# Patient Record
Sex: Male | Born: 1960 | Race: White | Hispanic: No | Marital: Single | Smoking: Former smoker
Health system: Southern US, Community
[De-identification: ages and names within clinical notes are randomized; demographics above are authoritative.]

## PROBLEM LIST (undated history)

## (undated) DIAGNOSIS — I2510 Atherosclerotic heart disease of native coronary artery without angina pectoris: Secondary | ICD-10-CM

## (undated) DIAGNOSIS — E785 Hyperlipidemia, unspecified: Secondary | ICD-10-CM

## (undated) DIAGNOSIS — F419 Anxiety disorder, unspecified: Secondary | ICD-10-CM

## (undated) DIAGNOSIS — I219 Acute myocardial infarction, unspecified: Secondary | ICD-10-CM

## (undated) DIAGNOSIS — I10 Essential (primary) hypertension: Secondary | ICD-10-CM

## (undated) DIAGNOSIS — K219 Gastro-esophageal reflux disease without esophagitis: Secondary | ICD-10-CM

## (undated) HISTORY — DX: Essential (primary) hypertension: I10

## (undated) HISTORY — DX: Gastro-esophageal reflux disease without esophagitis: K21.9

## (undated) HISTORY — PX: CORONARY ANGIOPLASTY WITH STENT PLACEMENT: SHX49

## (undated) HISTORY — DX: Hyperlipidemia, unspecified: E78.5

---
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
## 1993-04-29 HISTORY — PX: VASECTOMY: SHX75

## 2004-04-16 ENCOUNTER — Emergency Department (HOSPITAL_COMMUNITY): Admission: EM | Admit: 2004-04-16 | Discharge: 2004-04-17 | Payer: Self-pay | Admitting: Emergency Medicine

## 2004-05-24 ENCOUNTER — Encounter: Admission: RE | Admit: 2004-05-24 | Discharge: 2004-06-01 | Payer: Self-pay | Admitting: Occupational Medicine

## 2006-08-18 ENCOUNTER — Ambulatory Visit (HOSPITAL_BASED_OUTPATIENT_CLINIC_OR_DEPARTMENT_OTHER): Admission: RE | Admit: 2006-08-18 | Discharge: 2006-08-18 | Payer: Self-pay | Admitting: General Surgery

## 2006-08-18 HISTORY — PX: HERNIA REPAIR: SHX51

## 2010-03-15 ENCOUNTER — Encounter (HOSPITAL_COMMUNITY)
Admission: RE | Admit: 2010-03-15 | Discharge: 2010-05-29 | Payer: Self-pay | Source: Home / Self Care | Attending: Cardiology | Admitting: Cardiology

## 2010-05-30 ENCOUNTER — Encounter (HOSPITAL_COMMUNITY): Payer: BC Managed Care – PPO | Attending: Cardiology

## 2010-05-30 DIAGNOSIS — I10 Essential (primary) hypertension: Secondary | ICD-10-CM | POA: Insufficient documentation

## 2010-05-30 DIAGNOSIS — Z8249 Family history of ischemic heart disease and other diseases of the circulatory system: Secondary | ICD-10-CM | POA: Insufficient documentation

## 2010-05-30 DIAGNOSIS — Z87891 Personal history of nicotine dependence: Secondary | ICD-10-CM | POA: Insufficient documentation

## 2010-05-30 DIAGNOSIS — E663 Overweight: Secondary | ICD-10-CM | POA: Insufficient documentation

## 2010-05-30 DIAGNOSIS — Z5189 Encounter for other specified aftercare: Secondary | ICD-10-CM | POA: Insufficient documentation

## 2010-06-01 ENCOUNTER — Encounter (HOSPITAL_COMMUNITY): Payer: BC Managed Care – PPO

## 2010-06-04 ENCOUNTER — Encounter (HOSPITAL_COMMUNITY): Payer: BC Managed Care – PPO

## 2010-06-06 ENCOUNTER — Encounter (HOSPITAL_COMMUNITY): Payer: BC Managed Care – PPO

## 2010-06-08 ENCOUNTER — Encounter (HOSPITAL_COMMUNITY): Payer: BC Managed Care – PPO

## 2010-06-11 ENCOUNTER — Encounter (HOSPITAL_COMMUNITY): Payer: BC Managed Care – PPO

## 2010-06-13 ENCOUNTER — Encounter (HOSPITAL_COMMUNITY): Payer: BC Managed Care – PPO

## 2010-06-15 ENCOUNTER — Encounter (HOSPITAL_COMMUNITY): Payer: BC Managed Care – PPO

## 2010-06-18 ENCOUNTER — Encounter (HOSPITAL_COMMUNITY): Payer: BC Managed Care – PPO

## 2010-06-20 ENCOUNTER — Encounter (HOSPITAL_COMMUNITY): Payer: BC Managed Care – PPO

## 2010-06-22 ENCOUNTER — Encounter (HOSPITAL_COMMUNITY): Payer: BC Managed Care – PPO

## 2010-06-25 ENCOUNTER — Encounter (HOSPITAL_COMMUNITY): Payer: BC Managed Care – PPO

## 2010-06-27 ENCOUNTER — Encounter (HOSPITAL_COMMUNITY): Payer: BC Managed Care – PPO

## 2010-06-29 ENCOUNTER — Encounter (HOSPITAL_COMMUNITY): Payer: BC Managed Care – PPO

## 2010-07-02 ENCOUNTER — Encounter (HOSPITAL_COMMUNITY): Payer: BC Managed Care – PPO

## 2010-07-04 ENCOUNTER — Encounter (HOSPITAL_COMMUNITY): Payer: BC Managed Care – PPO

## 2010-07-06 ENCOUNTER — Encounter (HOSPITAL_COMMUNITY): Payer: BC Managed Care – PPO

## 2010-07-09 ENCOUNTER — Encounter (HOSPITAL_COMMUNITY): Payer: BC Managed Care – PPO

## 2010-07-11 ENCOUNTER — Encounter (HOSPITAL_COMMUNITY): Payer: BC Managed Care – PPO

## 2010-07-13 ENCOUNTER — Encounter (HOSPITAL_COMMUNITY): Payer: BC Managed Care – PPO

## 2010-07-16 ENCOUNTER — Encounter (HOSPITAL_COMMUNITY): Payer: BC Managed Care – PPO

## 2010-07-18 ENCOUNTER — Encounter (HOSPITAL_COMMUNITY): Payer: BC Managed Care – PPO

## 2010-07-20 ENCOUNTER — Encounter (HOSPITAL_COMMUNITY): Payer: BC Managed Care – PPO

## 2010-07-23 ENCOUNTER — Encounter (HOSPITAL_COMMUNITY): Payer: BC Managed Care – PPO

## 2010-07-25 ENCOUNTER — Encounter (HOSPITAL_COMMUNITY): Payer: BC Managed Care – PPO

## 2010-07-27 ENCOUNTER — Encounter (HOSPITAL_COMMUNITY): Payer: BC Managed Care – PPO

## 2010-07-30 ENCOUNTER — Encounter (HOSPITAL_COMMUNITY): Payer: BC Managed Care – PPO

## 2010-08-01 ENCOUNTER — Encounter (HOSPITAL_COMMUNITY): Payer: BC Managed Care – PPO

## 2010-08-03 ENCOUNTER — Encounter (HOSPITAL_COMMUNITY): Payer: BC Managed Care – PPO

## 2010-08-06 ENCOUNTER — Encounter (HOSPITAL_COMMUNITY): Payer: BC Managed Care – PPO

## 2010-08-08 ENCOUNTER — Encounter (HOSPITAL_COMMUNITY): Payer: BC Managed Care – PPO

## 2010-08-10 ENCOUNTER — Encounter (HOSPITAL_COMMUNITY): Payer: BC Managed Care – PPO

## 2010-08-13 ENCOUNTER — Encounter (HOSPITAL_COMMUNITY): Payer: BC Managed Care – PPO

## 2010-08-15 ENCOUNTER — Encounter (HOSPITAL_COMMUNITY): Payer: BC Managed Care – PPO

## 2010-08-17 ENCOUNTER — Encounter (HOSPITAL_COMMUNITY): Payer: BC Managed Care – PPO

## 2010-08-20 ENCOUNTER — Encounter (HOSPITAL_COMMUNITY): Payer: BC Managed Care – PPO

## 2010-09-14 NOTE — Op Note (Signed)
NAME:  Shane Shea, Shane Shea              ACCOUNT NO.:  [id number]   MEDICAL RECORD NO.:  [id number]          PATIENT TYPE:  AMB   LOCATION:  DSC                          FACILITY:  [facility]   PHYSICIAN:  Gabrielle Dare. Janee Morn, M.D.DATE OF BIRTH:  1960/11/07   DATE OF PROCEDURE:  08/18/2006  DATE OF DISCHARGE:                               OPERATIVE REPORT   PREOPERATIVE DIAGNOSIS:  Bilateral inguinal hernias.   POSTOPERATIVE DIAGNOSIS:  Bilateral inguinal hernias.   PROCEDURE:  Repair of bilateral inguinal hernia with mesh.   SURGEON:  Gabrielle Dare. Janee Morn, M.D.   ANESTHESIA:  General.   HISTORY OF PRESENT ILLNESS:  Shane Shea is a 50 year old gentleman who I  evaluated in the office for symptomatic bilateral inguinal hernias.  He  presents today for elective repair.   PROCEDURE IN DETAIL:  Informed consent was obtained.  The patient  received intravenous antibiotics.  He was brought to the operating room.  General anesthesia was administered by the anesthesia staff with  laryngeal mask airway.  The lower abdomen was prepped and draped in a  sterile fashion.  Marcaine 0.25% with epinephrine was injected along the  planned incision in the right groin and out towards the anterior  superior iliac spine for postoperative pain relief.  A right groin  incision was made.  Subcutaneous tissues were dissected down through  Scarpa fascia revealing the external oblique.  The external oblique was  divided sharply laterally and division continued down through the  external ring superiorly where the external oblique was dissected free  off the underlying transversalis.  The inferior leaflet of external  oblique was dissected down, revealing the shelving edge of the inguinal  ligament.  Cord structures were encircled with a Penrose drain.  Evaluation of the floor revealed a moderate sized direct inguinal  hernia.  The cord was then dissected and no indirect inguinal hernia sac  was present.  The direct  hernia was reduced without entering the sac.  A  figure-of-eight 2-0 Vicryl was placed to keep it reduced and  subsequently the repair was completed with a keyhole polypropylene mesh.  This was secured with 0 Prolene to the tissues over the pubic tubercle  and it was secured in a running fashion inferiorly along the shelving  edge of the inguinal ligament.  The superior portion of the mesh was  then tacked in an interrupted fashion with 0 Prolene to the tissues over  her pubic tubercle medially and then extending out laterally with  interrupted 0 Prolene tacking the mesh down to the transversalis  musculature.  The two ends of the mesh were rejoined behind the cord and  tacked together and down through the underlying musculature with  interrupted 0 Prolene sutures.  The aperture in the mesh admitted the  tip of a fifth digit.  Meticulous hemostasis was insured.  The area was  irrigated.  External oblique was closed with running 2-0 Vicryl suture.  Subcutaneous tissues were approximated with interrupted 3-0 Vicryl  suture.  The area was again irrigated and the skin was closed with a  running 4-0 Monocryl subcuticular stitch.  Sponge, needle, and  instrument counts were correct.  Benzoin and Steri-Strips were applied.  Attention was then directed to the left side.  Again, 0.25% Marcaine  with epinephrine was injected along the planned line of inguinal  incision and out towards the anterior superior iliac spine.  The left  groin incision was made.  Subcutaneous tissues were dissected down  through Scarpa fascia revealing the external oblique.  Hemostasis was  obtained with Bovie cautery.  External oblique was divided out laterally  and the division was continued down through the external ring.  The  superior leaflet of the external oblique was dissected free off the  transversalis.  The inferior leaflet was dissected down, revealing the  shelving edge of the inguinal ligament.  Cord  structures were encircled  with a Penrose drain.  Inspection of the floor revealed a large direct  hernia sac.  This was dissected free off the cord structures.  It did  reduce easily.  The sac was not entered.  The cord was generous size.  The cord was dissected.  No indirect hernia sac was noted, but there was  a large cord lipoma, which was excised.  The direct hernia sac was then  held in reduction and two figure-of-eight 2-0 Vicryls were placed from  the transversalis musculature over to the shelving edge of the inguinal  ligament to keep the hernia reduced during the completion of the repair.  The repair was then done with a keyhole polypropylene mesh, secured with  0 Prolene sutures, just as on the other side.  Meticulous hemostasis was  insured.  The aperture in the mesh was adjusted to just admit the tip of  the fifth digit next to the cord structures.  The cord structures  remained nonedematous and viable.  The area was irrigated.  External  oblique fascia was closed with a running 2-0 Vicryl suture.  Subcutaneous tissues were approximated with interrupted 3-0 Vicryl  sutures.  Skin was closed with a running 4-0 Monocryl subcuticular  stitch.  Sponge, needle, and instrument counts were correct again.  Benzoin, Steri-Strips, and sterile dressings were applied to both  wounds.  The patient tolerated the procedure well without apparent  complication and was taken to recovery room in stable condition.  Prior  to transport, testicles were relocated in anatomic position in the  scrotum.      Gabrielle Dare Janee Morn, M.D.  Electronically Signed     BET/MEDQ  D:  08/18/2006  T:  08/18/2006  Job:  [id number]   cc:   Vikki Ports, M.D.

## 2011-02-19 ENCOUNTER — Emergency Department (HOSPITAL_COMMUNITY): Payer: BC Managed Care – PPO

## 2011-02-19 ENCOUNTER — Emergency Department (HOSPITAL_COMMUNITY)
Admission: EM | Admit: 2011-02-19 | Discharge: 2011-02-19 | Disposition: A | Discharge status: Home / Self Care | Payer: BC Managed Care – PPO | Attending: Emergency Medicine | Admitting: Emergency Medicine

## 2011-02-19 DIAGNOSIS — R079 Chest pain, unspecified: Secondary | ICD-10-CM | POA: Insufficient documentation

## 2011-02-19 DIAGNOSIS — I252 Old myocardial infarction: Secondary | ICD-10-CM | POA: Insufficient documentation

## 2011-02-19 DIAGNOSIS — Z79899 Other long term (current) drug therapy: Secondary | ICD-10-CM | POA: Insufficient documentation

## 2011-02-19 DIAGNOSIS — I10 Essential (primary) hypertension: Secondary | ICD-10-CM | POA: Insufficient documentation

## 2011-02-19 DIAGNOSIS — R1013 Epigastric pain: Secondary | ICD-10-CM | POA: Insufficient documentation

## 2011-02-19 DIAGNOSIS — E7800 Pure hypercholesterolemia, unspecified: Secondary | ICD-10-CM | POA: Insufficient documentation

## 2011-02-19 LAB — COMPREHENSIVE METABOLIC PANEL
ALT: 51 U/L (ref 0–53)
AST: 26 U/L (ref 0–37)
Albumin: 4.5 g/dL (ref 3.5–5.2)
Alkaline Phosphatase: 111 U/L (ref 39–117)
BUN: 13 mg/dL (ref 6–23)
CO2: 31 mEq/L (ref 19–32)
Calcium: 10.3 mg/dL (ref 8.4–10.5)
Chloride: 100 mEq/L (ref 96–112)
Creatinine, Ser: 0.97 mg/dL (ref 0.50–1.35)
GFR calc non Af Amer: >90 mL/min (ref >90)
Glucose, Bld: 112 mg/dL — ABNORMAL HIGH (ref 70–99)
Potassium: 4.7 mEq/L (ref 3.5–5.1)
Total Bilirubin: 0.6 mg/dL (ref 0.3–1.2)
Total Protein: 8.1 g/dL (ref 6.0–8.3)

## 2011-02-19 LAB — LIPASE, BLOOD: Lipase: 38 U/L (ref 11–59)

## 2011-02-19 LAB — CBC
HCT: 47.3 % (ref 39.0–52.0)
Hemoglobin: 15.7 g/dL (ref 13.0–17.0)
MCH: 29 pg (ref 26.0–34.0)
MCHC: 33.2 g/dL (ref 30.0–36.0)
MCV: 87.3 fL (ref 78.0–100.0)
Platelets: 181 10*3/uL (ref 150–400)
RBC: 5.42 MIL/uL (ref 4.22–5.81)
RDW: 12.7 % (ref 11.5–15.5)
WBC: 5.5 10*3/uL (ref 4.0–10.5)

## 2011-02-19 LAB — DIFFERENTIAL
Basophils Absolute: 0.1 10*3/uL (ref 0.0–0.1)
Basophils Relative: 1 % (ref 0–1)
Eosinophils Relative: 1 % (ref 0–5)
Lymphocytes Relative: 20 % (ref 12–46)
Lymphs Abs: 1.1 10*3/uL (ref 0.7–4.0)
Monocytes Absolute: 0.4 10*3/uL (ref 0.1–1.0)
Monocytes Relative: 7 % (ref 3–12)
Neutro Abs: 4 10*3/uL (ref 1.7–7.7)
Neutrophils Relative %: 72 % (ref 43–77)

## 2011-02-19 LAB — POCT I-STAT TROPONIN I
Troponin i, poc: 0 ng/mL (ref 0.00–0.08)
Troponin i, poc: 0.01 ng/mL (ref 0.00–0.08)

## 2011-11-20 ENCOUNTER — Encounter (INDEPENDENT_AMBULATORY_CARE_PROVIDER_SITE_OTHER): Payer: Self-pay | Admitting: Surgery

## 2011-11-21 ENCOUNTER — Encounter (INDEPENDENT_AMBULATORY_CARE_PROVIDER_SITE_OTHER): Payer: Self-pay | Admitting: General Surgery

## 2011-11-21 ENCOUNTER — Encounter (INDEPENDENT_AMBULATORY_CARE_PROVIDER_SITE_OTHER): Payer: Self-pay | Admitting: Surgery

## 2011-11-21 ENCOUNTER — Ambulatory Visit (INDEPENDENT_AMBULATORY_CARE_PROVIDER_SITE_OTHER): Payer: BC Managed Care – PPO | Admitting: Surgery

## 2011-11-21 VITALS — BP 154/90 | HR 53 | Temp 97.4°F | Ht 70.0 in | Wt 178.6 lb

## 2011-11-21 DIAGNOSIS — K4091 Unilateral inguinal hernia, without obstruction or gangrene, recurrent: Secondary | ICD-10-CM

## 2011-11-21 NOTE — Progress Notes (Signed)
Patient ID: Shane Shea, male   DOB: 07/29/1960, 51 y.o.   MRN: [id number]  Chief Complaint  Patient presents with  . Pre-op Exam    eval LIH    HPI Shane Shea is a 51 y.o. male.  Self-referred for hernia HPI This is a 51-year-old male who is status post open bilateral inguinal hernia repairs with mesh for bilateral direct hernias by Dr. Burke Thompson in 2008. Over the last couple of years the patient has developed a recurrent mass in his left groin. This has become larger and more uncomfortable. Unfortunately he has also had a myocardial infarction during the last few years. He had stents placed in [place] and is followed by Dr. Tyson a cardiologist in [place]. He is on aspirin as well as Effient. He denies any obstructive symptoms. He presents now for surgical evaluation. Past Medical History  Diagnosis Date  . GERD (gastroesophageal reflux disease)   . Hyperlipidemia   . Hypertension   . Heart attack     Past Surgical History  Procedure Date  . Hernia repair 08/18/2006    Bilateral Inguinal Hernias  . External ear surgery 1968  . Vasectomy 1995    History reviewed. No pertinent family history.  Social History History  Substance Use Topics  . Smoking status: Former Smoker  . Smokeless tobacco: Former User    Quit date: 11/21/1987  . Alcohol Use: No    No Known Allergies  Current Outpatient Prescriptions  Medication Sig Dispense Refill  . aspirin 81 MG tablet Take 81 mg by mouth daily.      . atorvastatin (LIPITOR) 80 MG tablet       . EFFIENT 10 MG TABS       . metoprolol tartrate (LOPRESSOR) 25 MG tablet       . nitroGLYCERIN (NITROSTAT) 0.4 MG SL tablet Place 0.4 mg under the tongue as needed.      . pantoprazole (PROTONIX) 40 MG tablet Take 40 mg by mouth daily.        Review of Systems Review of Systems  Constitutional: Negative for fever, chills and unexpected weight change.  HENT: Negative for hearing loss, congestion, sore throat, trouble  swallowing and voice change.   Eyes: Negative for visual disturbance.  Respiratory: Negative for cough and wheezing.   Cardiovascular: Negative for chest pain, palpitations and leg swelling.  Gastrointestinal: Negative for nausea, vomiting, abdominal pain, diarrhea, constipation, blood in stool, abdominal distention, anal bleeding and rectal pain.  Genitourinary: Positive for scrotal swelling. Negative for hematuria and difficulty urinating.  Musculoskeletal: Negative for arthralgias.  Skin: Negative for rash and wound.  Neurological: Negative for seizures, syncope, weakness and headaches.  Hematological: Negative for adenopathy. Does not bruise/bleed easily.  Psychiatric/Behavioral: Negative for confusion.    Blood pressure 154/90, pulse 53, temperature 97.4 F (36.3 C), temperature source Temporal, height 5' 10" (1.778 m), weight 178 lb 9.6 oz (81.012 kg), SpO2 98.00%.  Physical Exam Physical Exam WDWN in NAD HEENT:  EOMI, sclera anicteric Neck:  No masses, no thyromegaly Lungs:  CTA bilaterally; normal respiratory effort CV:  Regular rate and rhythm; no murmurs Abd:  +bowel sounds, soft, non-tender, no masses GU:  Healed incisions; large palpable left inguinal hernia - partially reducible; tender with palpation Ext:  Well-perfused; no edema Skin:  Warm, dry; no sign of jaundice  Data Reviewed none  Assessment    Recurrent left inguinal hernia. Coronary artery disease    Plan    Left inguinal   hernia repair with mesh.  The surgical procedure has been discussed with the patient.  Potential risks, benefits, alternative treatments, and expected outcomes have been explained.  All of the patient's questions at this time have been answered.  The likelihood of reaching the patient's treatment goal is good.  The patient understand the proposed surgical procedure and wishes to proceed. We will first obtain cardiac clearance from the cardiologist.  He will need to hold aspirin and  Effient prior to surgery.       Amee Boothe K. 11/21/2011, 1:11 PM    

## 2011-12-03 ENCOUNTER — Other Ambulatory Visit (INDEPENDENT_AMBULATORY_CARE_PROVIDER_SITE_OTHER): Payer: Self-pay | Admitting: Surgery

## 2011-12-11 ENCOUNTER — Encounter (HOSPITAL_BASED_OUTPATIENT_CLINIC_OR_DEPARTMENT_OTHER): Payer: Self-pay | Admitting: *Deleted

## 2011-12-11 NOTE — Progress Notes (Signed)
Bring all medications. Coming in for Bmet on Friday. Requested last office notes, EkG from Dr. Chales Abrahams Cardiologist)  in [place].

## 2011-12-13 ENCOUNTER — Encounter (HOSPITAL_BASED_OUTPATIENT_CLINIC_OR_DEPARTMENT_OTHER)
Admission: RE | Admit: 2011-12-13 | Discharge: 2011-12-13 | Disposition: A | Discharge status: Home / Self Care | Payer: BC Managed Care – PPO | Source: Ambulatory Visit | Attending: Surgery | Admitting: Surgery

## 2011-12-13 LAB — BASIC METABOLIC PANEL
BUN: 21 mg/dL (ref 6–23)
Calcium: 9.2 mg/dL (ref 8.4–10.5)
Chloride: 101 mEq/L (ref 96–112)
GFR calc Af Amer: >90 mL/min (ref >90)
GFR calc non Af Amer: >90 mL/min (ref >90)
Glucose, Bld: 94 mg/dL (ref 70–99)
Potassium: 4.6 mEq/L (ref 3.5–5.1)
Sodium: 139 mEq/L (ref 135–145)

## 2011-12-17 ENCOUNTER — Encounter (HOSPITAL_BASED_OUTPATIENT_CLINIC_OR_DEPARTMENT_OTHER)
Admission: RE | Disposition: A | Discharge status: Home / Self Care | Payer: Self-pay | Source: Ambulatory Visit | Attending: Surgery

## 2011-12-17 ENCOUNTER — Encounter (HOSPITAL_BASED_OUTPATIENT_CLINIC_OR_DEPARTMENT_OTHER): Payer: Self-pay | Admitting: Anesthesiology

## 2011-12-17 ENCOUNTER — Encounter (HOSPITAL_BASED_OUTPATIENT_CLINIC_OR_DEPARTMENT_OTHER): Payer: Self-pay | Admitting: Certified Registered"

## 2011-12-17 ENCOUNTER — Encounter (HOSPITAL_BASED_OUTPATIENT_CLINIC_OR_DEPARTMENT_OTHER): Payer: Self-pay | Admitting: *Deleted

## 2011-12-17 ENCOUNTER — Ambulatory Visit (HOSPITAL_BASED_OUTPATIENT_CLINIC_OR_DEPARTMENT_OTHER)
Admission: RE | Admit: 2011-12-17 | Discharge: 2011-12-17 | Disposition: A | Discharge status: Home / Self Care | Payer: BC Managed Care – PPO | Source: Ambulatory Visit | Attending: Surgery | Admitting: Surgery

## 2011-12-17 ENCOUNTER — Ambulatory Visit (HOSPITAL_BASED_OUTPATIENT_CLINIC_OR_DEPARTMENT_OTHER): Payer: BC Managed Care – PPO | Admitting: Anesthesiology

## 2011-12-17 DIAGNOSIS — K4091 Unilateral inguinal hernia, without obstruction or gangrene, recurrent: Secondary | ICD-10-CM | POA: Insufficient documentation

## 2011-12-17 DIAGNOSIS — Z01812 Encounter for preprocedural laboratory examination: Secondary | ICD-10-CM | POA: Insufficient documentation

## 2011-12-17 DIAGNOSIS — E785 Hyperlipidemia, unspecified: Secondary | ICD-10-CM | POA: Insufficient documentation

## 2011-12-17 DIAGNOSIS — K219 Gastro-esophageal reflux disease without esophagitis: Secondary | ICD-10-CM | POA: Insufficient documentation

## 2011-12-17 DIAGNOSIS — I10 Essential (primary) hypertension: Secondary | ICD-10-CM | POA: Insufficient documentation

## 2011-12-17 HISTORY — DX: Anxiety disorder, unspecified: F41.9

## 2011-12-17 HISTORY — PX: INGUINAL HERNIA REPAIR: SHX194

## 2011-12-17 SURGERY — REPAIR, HERNIA, INGUINAL, ADULT
Anesthesia: General | Site: Abdomen | Laterality: Left | Wound class: Clean

## 2011-12-17 MED ORDER — MORPHINE SULFATE 2 MG/ML IJ SOLN: 2.0000 mg | INTRAMUSCULAR | Status: DC | PRN

## 2011-12-17 MED ORDER — SCOPOLAMINE 1 MG/3DAYS TD PT72
1.0000 | MEDICATED_PATCH | Freq: Once | TRANSDERMAL | Status: DC
  Administered 2011-12-17: 1.5 mg via TRANSDERMAL

## 2011-12-17 MED ORDER — ONDANSETRON HCL 4 MG/2ML IJ SOLN
INTRAMUSCULAR | Status: DC | PRN
  Administered 2011-12-17: 4 mg via INTRAVENOUS

## 2011-12-17 MED ORDER — DEXAMETHASONE SODIUM PHOSPHATE 4 MG/ML IJ SOLN
INTRAMUSCULAR | Status: DC | PRN
  Administered 2011-12-17: 10 mg via INTRAVENOUS

## 2011-12-17 MED ORDER — OXYCODONE HCL 5 MG PO TABS: 5.0000 mg | ORAL_TABLET | Freq: Once | ORAL | Status: DC | PRN

## 2011-12-17 MED ORDER — CEFAZOLIN SODIUM-DEXTROSE 2-3 GM-% IV SOLR: 2.0000 g | INTRAVENOUS | Status: DC

## 2011-12-17 MED ORDER — OXYCODONE-ACETAMINOPHEN 5-325 MG PO TABS: 1.0000 | ORAL_TABLET | ORAL | Status: DC | PRN

## 2011-12-17 MED ORDER — PROPOFOL 10 MG/ML IV EMUL: INTRAVENOUS | Status: DC | PRN

## 2011-12-17 MED ORDER — BUPIVACAINE-EPINEPHRINE 0.25% -1:200000 IJ SOLN
INTRAMUSCULAR | Status: DC | PRN
  Administered 2011-12-17: 10 mL

## 2011-12-17 MED ORDER — MIDAZOLAM HCL 2 MG/2ML IJ SOLN
1.0000 mg | INTRAMUSCULAR | Status: DC | PRN
  Administered 2011-12-17: 2 mg via INTRAVENOUS

## 2011-12-17 MED ORDER — LACTATED RINGERS IV SOLN
INTRAVENOUS | Status: DC
  Administered 2011-12-17 (×2): via INTRAVENOUS

## 2011-12-17 MED ORDER — KETOROLAC TROMETHAMINE 30 MG/ML IJ SOLN
INTRAMUSCULAR | Status: DC | PRN
  Administered 2011-12-17: 30 mg via INTRAVENOUS

## 2011-12-17 MED ORDER — HYDROMORPHONE HCL PF 1 MG/ML IJ SOLN: 0.2500 mg | INTRAMUSCULAR | Status: DC | PRN

## 2011-12-17 MED ORDER — PROPOFOL 10 MG/ML IV EMUL
INTRAVENOUS | Status: DC | PRN
  Administered 2011-12-17: 230 mg via INTRAVENOUS
  Administered 2011-12-17: 35 mg via INTRAVENOUS

## 2011-12-17 MED ORDER — OXYCODONE-ACETAMINOPHEN 5-325 MG PO TABS: 1.0000 | ORAL_TABLET | ORAL | Status: AC | PRN

## 2011-12-17 MED ORDER — FENTANYL CITRATE 0.05 MG/ML IJ SOLN
50.0000 ug | INTRAMUSCULAR | Status: DC | PRN
  Administered 2011-12-17: 100 ug via INTRAVENOUS

## 2011-12-17 MED ORDER — GLYCOPYRROLATE 0.2 MG/ML IJ SOLN
INTRAMUSCULAR | Status: DC | PRN
  Administered 2011-12-17 (×2): 0.1 mg via INTRAVENOUS

## 2011-12-17 MED ORDER — ACETAMINOPHEN 10 MG/ML IV SOLN
1000.0000 mg | Freq: Once | INTRAVENOUS | Status: AC
  Administered 2011-12-17: 1000 mg via INTRAVENOUS

## 2011-12-17 MED ORDER — ONDANSETRON HCL 4 MG/2ML IJ SOLN: 4.0000 mg | INTRAMUSCULAR | Status: DC | PRN

## 2011-12-17 MED ORDER — FENTANYL CITRATE 0.05 MG/ML IJ SOLN
INTRAMUSCULAR | Status: DC | PRN
  Administered 2011-12-17: 25 ug via INTRAVENOUS

## 2011-12-17 MED ORDER — MIDAZOLAM HCL 5 MG/5ML IJ SOLN
INTRAMUSCULAR | Status: DC | PRN
  Administered 2011-12-17: 0.5 mg via INTRAVENOUS

## 2011-12-17 MED ORDER — OXYCODONE HCL 5 MG/5ML PO SOLN: 5.0000 mg | Freq: Once | ORAL | Status: DC | PRN

## 2011-12-17 MED ORDER — CHLORHEXIDINE GLUCONATE 4 % EX LIQD: 1.0000 "application " | Freq: Once | CUTANEOUS | Status: DC

## 2011-12-17 MED ORDER — METOCLOPRAMIDE HCL 5 MG/ML IJ SOLN: 10.0000 mg | Freq: Once | INTRAMUSCULAR | Status: DC | PRN

## 2011-12-17 MED ORDER — BUPIVACAINE HCL (PF) 0.5 % IJ SOLN
INTRAMUSCULAR | Status: DC | PRN
  Administered 2011-12-17: 25 mL

## 2011-12-17 SURGICAL SUPPLY — 55 items
APL SKNCLS STERI-STRIP NONHPOA (GAUZE/BANDAGES/DRESSINGS) ×2
BENZOIN TINCTURE PRP APPL 2/3 (GAUZE/BANDAGES/DRESSINGS) ×3 IMPLANT
BLADE HEX COATED 2.75 (ELECTRODE) ×3 IMPLANT
BLADE SURG 15 STRL LF DISP TIS (BLADE) ×2 IMPLANT
BLADE SURG 15 STRL SS (BLADE) ×3
BLADE SURG ROTATE 9660 (MISCELLANEOUS) IMPLANT
CANISTER SUCTION 1200CC (MISCELLANEOUS) IMPLANT
CHLORAPREP W/TINT 26ML (MISCELLANEOUS) ×3 IMPLANT
CLOTH BEACON ORANGE TIMEOUT ST (SAFETY) ×3 IMPLANT
COVER MAYO STAND STRL (DRAPES) ×3 IMPLANT
COVER TABLE BACK 60X90 (DRAPES) ×3 IMPLANT
DECANTER SPIKE VIAL GLASS SM (MISCELLANEOUS) ×3 IMPLANT
DRAIN PENROSE 1/2X12 LTX STRL (WOUND CARE) ×3 IMPLANT
DRAPE LAPAROTOMY TRNSV 102X78 (DRAPE) ×3 IMPLANT
DRAPE UTILITY XL STRL (DRAPES) ×3 IMPLANT
DRSG TEGADERM 4X4.75 (GAUZE/BANDAGES/DRESSINGS) ×3 IMPLANT
ELECT REM PT RETURN 9FT ADLT (ELECTROSURGICAL) ×3
ELECTRODE REM PT RTRN 9FT ADLT (ELECTROSURGICAL) ×2 IMPLANT
GAUZE SPONGE 4X4 12PLY STRL LF (GAUZE/BANDAGES/DRESSINGS) ×3 IMPLANT
GLOVE BIO SURGEON STRL SZ 6.5 (GLOVE) ×2 IMPLANT
GLOVE BIO SURGEON STRL SZ7 (GLOVE) ×3 IMPLANT
GLOVE BIOGEL PI IND STRL 7.5 (GLOVE) ×3 IMPLANT
GLOVE BIOGEL PI INDICATOR 7.5 (GLOVE) ×2
GLOVE SKINSENSE NS SZ7.0 (GLOVE) ×1
GLOVE SKINSENSE STRL SZ7.0 (GLOVE) ×1 IMPLANT
GOWN PREVENTION PLUS XLARGE (GOWN DISPOSABLE) ×8 IMPLANT
NDL HYPO 25X1 1.5 SAFETY (NEEDLE) ×1 IMPLANT
NEEDLE HYPO 25X1 1.5 SAFETY (NEEDLE) ×3 IMPLANT
NS IRRIG 1000ML POUR BTL (IV SOLUTION) ×2 IMPLANT
PACK BASIN DAY SURGERY FS (CUSTOM PROCEDURE TRAY) ×3 IMPLANT
PAIN PUMP ON-Q 100MLX2ML 2.5IN (PAIN MANAGEMENT) IMPLANT
PENCIL BUTTON HOLSTER BLD 10FT (ELECTRODE) ×3 IMPLANT
SLEEVE SCD COMPRESS KNEE MED (MISCELLANEOUS) ×3 IMPLANT
SPONGE INTESTINAL PEANUT (DISPOSABLE) ×3 IMPLANT
SPONGE LAP 18X18 X RAY DECT (DISPOSABLE) IMPLANT
SPONGE LAP 4X18 X RAY DECT (DISPOSABLE) ×3 IMPLANT
STRIP CLOSURE SKIN 1/2X4 (GAUZE/BANDAGES/DRESSINGS) ×3 IMPLANT
SUT MON AB 4-0 PC3 18 (SUTURE) ×3 IMPLANT
SUT PDS AB 0 CT 36 (SUTURE) IMPLANT
SUT PROLENE 2 0 SH DA (SUTURE) ×3 IMPLANT
SUT SILK 2 0 SH (SUTURE) IMPLANT
SUT SILK 3 0 SH 30 (SUTURE) IMPLANT
SUT SILK 3 0 TIES 17X18 (SUTURE)
SUT SILK 3-0 18XBRD TIE BLK (SUTURE) IMPLANT
SUT VIC AB 0 SH 27 (SUTURE) IMPLANT
SUT VIC AB 2-0 SH 27 (SUTURE) ×6
SUT VIC AB 2-0 SH 27XBRD (SUTURE) ×4 IMPLANT
SUT VIC AB 3-0 SH 27 (SUTURE) ×3
SUT VIC AB 3-0 SH 27X BRD (SUTURE) ×2 IMPLANT
SYR CONTROL 10ML LL (SYRINGE) ×3 IMPLANT
TOWEL OR 17X24 6PK STRL BLUE (TOWEL DISPOSABLE) ×3 IMPLANT
TOWEL OR NON WOVEN STRL DISP B (DISPOSABLE) ×3 IMPLANT
TUBE CONNECTING 20X1/4 (TUBING) IMPLANT
WATER STERILE IRR 1000ML POUR (IV SOLUTION) ×1 IMPLANT
YANKAUER SUCT BULB TIP NO VENT (SUCTIONS) IMPLANT

## 2011-12-17 NOTE — Op Note (Signed)
Hernia, Open, Procedure Note  Indications: The patient presented with a history of a left, reducible recurrent inguinal hernia.    Pre-operative Diagnosis: left reducible recurrent inguinal hernia Post-operative Diagnosis: same  Surgeon: Wynona Luna.   Assistants: none  Anesthesia: General LMA anesthesia  ASA Class: 3  Procedure Details  The patient was seen again in the Holding Room. The risks, benefits, complications, treatment options, and expected outcomes were discussed with the patient. The possibilities of reaction to medication, pulmonary aspiration, perforation of viscus, bleeding, recurrent infection, the need for additional procedures, and development of a complication requiring transfusion or further operation were discussed with the patient and/or family. The likelihood of success in repairing the hernia and returning the patient to their previous functional status is good.  There was concurrence with the proposed plan, and informed consent was obtained. The site of surgery was properly noted/marked. The patient was taken to the Operating Room, identified as Shane Shea, and the procedure verified as left inguinal hernia repair. A Time Out was held and the above information confirmed.  The patient was placed in the supine position and underwent induction of anesthesia. The lower abdomen and groin was prepped with Chloraprep and draped in the standard fashion, and 0.5% Marcaine with epinephrine was used to anesthetize the skin over the mid-portion of the inguinal canal. An oblique incision was made. Dissection was carried down through the scar in the subcutaneous tissue with cautery to the external oblique fascia.  We opened the external oblique fascia along the direction of its fibers to the external ring.  The spermatic cord was circumferentially dissected bluntly and retracted with a Penrose drain.  The floor of the inguinal canal was inspected.  The previously placed mesh  covered the floor of the inguinal canal, but was not attached to the pubic tubercle, resulting in a direct defect.  We reduced the large hernia sac through the defect, which measured only about 1 cm across.  We reattached the mesh to the pubic tubercle with 2-0 Prolene. The spermatic cord was examined and showed no sign of indirect hernia.  The external oblique fascia was reapproximated with 2-0 Vicryl.  3-0 Vicryl was used to close the subcutaneous tissues and 4-0 Monocryl was used to close the skin in subcuticular fashion.  Benzoin and steri-strips were used to seal the incision.  A clean dressing was applied.  The patient was then extubated and brought to the recovery room in stable condition.  All sponge, instrument, and needle counts were correct prior to closure and at the conclusion of the case.   Estimated Blood Loss: less than 50 mL                 Complications: None; patient tolerated the procedure well.         Disposition: PACU - hemodynamically stable.         Condition: stable  Wilmon Arms. Corliss Skains, MD, [facility]  12/17/2011 3:43 PM

## 2011-12-17 NOTE — Anesthesia Postprocedure Evaluation (Signed)
Anesthesia Post Note  Patient: Shane Shea  Procedure(s) Performed: Procedure(s) (LRB): HERNIA REPAIR INGUINAL ADULT (Left)  Anesthesia type: General  Patient location: PACU  Post pain: Pain level controlled  Post assessment: Patient's Cardiovascular Status Stable  Last Vitals:  Filed Vitals:   12/17/11 1645  BP: 153/94  Pulse: 58  Temp: 36.3 C  Resp: 16    Post vital signs: Reviewed and stable  Level of consciousness: alert  Complications: No apparent anesthesia complications

## 2011-12-17 NOTE — Progress Notes (Signed)
Assisted Dr. Leiland with left, ultrasound guided, transabdominal plane block. Side rails up, monitors on throughout procedure. See vital signs in flow sheet. Tolerated Procedure well. 

## 2011-12-17 NOTE — Anesthesia Procedure Notes (Signed)
Anesthesia Regional Block:  TAP block  Pre-Anesthetic Checklist: ,, timeout performed, Correct Patient, Correct Site, Correct Laterality, Correct Procedure, Correct Position, site marked, Risks and benefits discussed,  Surgical consent,  Pre-op evaluation,  At surgeon's request and post-op pain management  Laterality: Left  Prep: chloraprep       Needles:  Injection technique: Single-shot  Needle Type: Echogenic Needle     Needle Length: 9cm  Needle Gauge: 21    Additional Needles:  Procedures: ultrasound guided TAP block Narrative:  Start time: 12/17/2011 1:33 PM End time: 12/17/2011 1:39 PM Injection made incrementally with aspirations every 5 mL.  Performed by: Personally  Anesthesiologist: Aldona Lento, MD  Additional Notes: Ultrasound guidance used to: id relevant anatomy, confirm needle position, local anesthetic spread, avoidance of vascular puncture. Picture saved. No complications. Block performed personally by Janetta Hora. Gelene Mink, MD    TAP block

## 2011-12-17 NOTE — Interval H&P Note (Signed)
History and Physical Interval Note:  12/17/2011 1:50 PM  Shane Shea  has presented today for surgery, with the diagnosis of left inguinal hernia  The various methods of treatment have been discussed with the patient and family. After consideration of risks, benefits and other options for treatment, the patient has consented to  Procedure(s) (LRB): HERNIA REPAIR INGUINAL ADULT (Left) INSERTION OF MESH (Left) as a surgical intervention .  The patient's history has been reviewed, patient examined, no change in status, stable for surgery.  I have reviewed the patient's chart and labs.  Questions were answered to the patient's satisfaction.     Chang Tiggs K.

## 2011-12-17 NOTE — Transfer of Care (Signed)
Immediate Anesthesia Transfer of Care Note  Patient: Shane Shea  Procedure(s) Performed: Procedure(s) (LRB): HERNIA REPAIR INGUINAL ADULT (Left)  Patient Location: PACU  Anesthesia Type: GA combined with regional for post-op pain  Level of Consciousness: sedated and unresponsive  Airway & Oxygen Therapy: Patient Spontanous Breathing and Patient connected to face mask oxygen  Post-op Assessment: Report given to PACU RN and Post -op Vital signs reviewed and stable  Post vital signs: Reviewed and stable  Complications: No apparent anesthesia complications

## 2011-12-17 NOTE — H&P (View-Only) (Signed)
Patient ID: Shane Shea, male   DOB: [birth year], 52 y.o.   MRN: [id number]  Chief Complaint  Patient presents with  . Pre-op Exam    eval LIH    HPI Shane Shea is a 51 y.o. male.  Self-referred for hernia HPI This is a 51 year old male who is status post open bilateral inguinal hernia repairs with mesh for bilateral direct hernias by Dr. Violeta Gelinas in 2008. Over the last couple of years the patient has developed a recurrent mass in his left groin. This has become larger and more uncomfortable. Unfortunately he has also had a myocardial infarction during the last few years. He had stents placed in [facility] and is followed by Dr. Chales Abrahams a cardiologist in [facility]. He is on aspirin as well as Effient. He denies any obstructive symptoms. He presents now for surgical evaluation. Past Medical History  Diagnosis Date  . GERD (gastroesophageal reflux disease)   . Hyperlipidemia   . Hypertension   . Heart attack     Past Surgical History  Procedure Date  . Hernia repair 08/18/2006    Bilateral Inguinal Hernias  . External ear surgery 1968  . Vasectomy 1995    History reviewed. No pertinent family history.  Social History History  Substance Use Topics  . Smoking status: Former Games developer  . Smokeless tobacco: Former Neurosurgeon    Quit date: 11/21/1987  . Alcohol Use: No    No Known Allergies  Current Outpatient Prescriptions  Medication Sig Dispense Refill  . aspirin 81 MG tablet Take 81 mg by mouth daily.      Marland Kitchen atorvastatin (LIPITOR) 80 MG tablet       . EFFIENT 10 MG TABS       . metoprolol tartrate (LOPRESSOR) 25 MG tablet       . nitroGLYCERIN (NITROSTAT) 0.4 MG SL tablet Place 0.4 mg under the tongue as needed.      . pantoprazole (PROTONIX) 40 MG tablet Take 40 mg by mouth daily.        Review of Systems Review of Systems  Constitutional: Negative for fever, chills and unexpected weight change.  HENT: Negative for hearing loss, congestion, sore throat, trouble  swallowing and voice change.   Eyes: Negative for visual disturbance.  Respiratory: Negative for cough and wheezing.   Cardiovascular: Negative for chest pain, palpitations and leg swelling.  Gastrointestinal: Negative for nausea, vomiting, abdominal pain, diarrhea, constipation, blood in stool, abdominal distention, anal bleeding and rectal pain.  Genitourinary: Positive for scrotal swelling. Negative for hematuria and difficulty urinating.  Musculoskeletal: Negative for arthralgias.  Skin: Negative for rash and wound.  Neurological: Negative for seizures, syncope, weakness and headaches.  Hematological: Negative for adenopathy. Does not bruise/bleed easily.  Psychiatric/Behavioral: Negative for confusion.    Blood pressure 154/90, pulse 53, temperature 97.4 F (36.3 C), temperature source Temporal, height 5\' 10"  (1.778 m), weight 178 lb 9.6 oz (81.012 kg), SpO2 98.00%.  Physical Exam Physical Exam WDWN in NAD HEENT:  EOMI, sclera anicteric Neck:  No masses, no thyromegaly Lungs:  CTA bilaterally; normal respiratory effort CV:  Regular rate and rhythm; no murmurs Abd:  +bowel sounds, soft, non-tender, no masses GU:  Healed incisions; large palpable left inguinal hernia - partially reducible; tender with palpation Ext:  Well-perfused; no edema Skin:  Warm, dry; no sign of jaundice  Data Reviewed none  Assessment    Recurrent left inguinal hernia. Coronary artery disease    Plan    Left inguinal  hernia repair with mesh.  The surgical procedure has been discussed with the patient.  Potential risks, benefits, alternative treatments, and expected outcomes have been explained.  All of the patient's questions at this time have been answered.  The likelihood of reaching the patient's treatment goal is good.  The patient understand the proposed surgical procedure and wishes to proceed. We will first obtain cardiac clearance from the cardiologist.  He will need to hold aspirin and  Effient prior to surgery.       Buelah Rennie K. 11/21/2011, 1:11 PM

## 2011-12-17 NOTE — Anesthesia Preprocedure Evaluation (Signed)
Anesthesia Evaluation  Patient identified by MRN, date of birth, ID band Patient awake    Reviewed: Allergy & Precautions, H&P , NPO status , Patient's Chart, lab work & pertinent test results, reviewed documented beta blocker date and time   Airway Mallampati: II TM Distance: >3 FB Neck ROM: full    Dental   Pulmonary neg pulmonary ROS,  breath sounds clear to auscultation        Cardiovascular hypertension, On Medications and On Home Beta Blockers + Past MI and + Cardiac Stents negative cardio ROS  Rhythm:regular     Neuro/Psych PSYCHIATRIC DISORDERS Anxiety negative neurological ROS     GI/Hepatic Neg liver ROS, GERD-  Medicated and Controlled,  Endo/Other  negative endocrine ROS  Renal/GU negative Renal ROS  negative genitourinary   Musculoskeletal   Abdominal   Peds  Hematology negative hematology ROS (+)   Anesthesia Other Findings See surgeon's H&P   Reproductive/Obstetrics negative OB ROS                           Anesthesia Physical Anesthesia Plan  ASA: III  Anesthesia Plan: General   Post-op Pain Management:    Induction: Intravenous  Airway Management Planned: LMA  Additional Equipment:   Intra-op Plan:   Post-operative Plan: Extubation in OR  Informed Consent: I have reviewed the patients History and Physical, chart, labs and discussed the procedure including the risks, benefits and alternatives for the proposed anesthesia with the patient or authorized representative who has indicated his/her understanding and acceptance.   Dental Advisory Given  Plan Discussed with: CRNA and Surgeon  Anesthesia Plan Comments:         Anesthesia Quick Evaluation

## 2011-12-18 ENCOUNTER — Encounter (HOSPITAL_BASED_OUTPATIENT_CLINIC_OR_DEPARTMENT_OTHER): Payer: Self-pay | Admitting: Surgery

## 2012-01-03 ENCOUNTER — Encounter (INDEPENDENT_AMBULATORY_CARE_PROVIDER_SITE_OTHER): Payer: Self-pay | Admitting: Surgery

## 2012-01-03 ENCOUNTER — Ambulatory Visit (INDEPENDENT_AMBULATORY_CARE_PROVIDER_SITE_OTHER): Payer: BC Managed Care – PPO | Admitting: Surgery

## 2012-01-03 VITALS — BP 126/84 | HR 72 | Temp 97.2°F | Resp 18 | Ht 70.0 in | Wt 175.2 lb

## 2012-01-03 DIAGNOSIS — K4091 Unilateral inguinal hernia, without obstruction or gangrene, recurrent: Secondary | ICD-10-CM

## 2012-01-03 NOTE — Progress Notes (Signed)
Patient ID: Shane Shea, male   DOB: Oct 28, 1960, 51 y.o.   MRN: [id number]  S/p open repair of recurrent LIH on 12/17/11.  He had a direct recurrence, but we were able to tack down the old mesh.  He has had some hematoma formation secondary to anticoagulation.  No sign of recurrent hernia.  No sign of infection.  Return to work on light duty for 3 more weeks.    Follow-up 3 weeks.  Wilmon Arms. Corliss Skains, MD, [facility]  01/03/2012 9:20 AM

## 2012-01-21 ENCOUNTER — Ambulatory Visit (INDEPENDENT_AMBULATORY_CARE_PROVIDER_SITE_OTHER): Payer: BC Managed Care – PPO | Admitting: Surgery

## 2012-01-21 ENCOUNTER — Encounter (INDEPENDENT_AMBULATORY_CARE_PROVIDER_SITE_OTHER): Payer: Self-pay | Admitting: Surgery

## 2012-01-21 VITALS — BP 127/79 | HR 82 | Temp 97.6°F | Resp 12 | Ht 70.0 in | Wt 176.0 lb

## 2012-01-21 DIAGNOSIS — K4091 Unilateral inguinal hernia, without obstruction or gangrene, recurrent: Secondary | ICD-10-CM

## 2012-01-21 NOTE — Progress Notes (Signed)
The patient is here for a final recheck after open repair of a recurrent left inguinal hernia on 12/17/11. The hematoma that was present last time seemed to resolve completely. No sign of recurrent hernia. No sign of infection at the incision. He has minimal firm scarring underneath the incision.  The patient may resume full extremity. I gave him a note to return to work. He may follow up with [place] as needed.  Shane Shea. Corliss Skains, MD, [facility]  01/21/2012 4:30 PM

## 2012-05-18 ENCOUNTER — Observation Stay (HOSPITAL_COMMUNITY)
Admission: EM | Admit: 2012-05-18 | Discharge: 2012-05-19 | Disposition: A | Discharge status: Home / Self Care | Payer: BC Managed Care – PPO | Attending: Cardiology | Admitting: Cardiology

## 2012-05-18 ENCOUNTER — Emergency Department (HOSPITAL_COMMUNITY): Payer: BC Managed Care – PPO

## 2012-05-18 ENCOUNTER — Encounter (HOSPITAL_COMMUNITY): Payer: Self-pay | Admitting: Cardiology

## 2012-05-18 DIAGNOSIS — R42 Dizziness and giddiness: Secondary | ICD-10-CM | POA: Insufficient documentation

## 2012-05-18 DIAGNOSIS — E785 Hyperlipidemia, unspecified: Secondary | ICD-10-CM

## 2012-05-18 DIAGNOSIS — I252 Old myocardial infarction: Secondary | ICD-10-CM | POA: Insufficient documentation

## 2012-05-18 DIAGNOSIS — F411 Generalized anxiety disorder: Secondary | ICD-10-CM | POA: Insufficient documentation

## 2012-05-18 DIAGNOSIS — R0602 Shortness of breath: Secondary | ICD-10-CM | POA: Insufficient documentation

## 2012-05-18 DIAGNOSIS — K219 Gastro-esophageal reflux disease without esophagitis: Secondary | ICD-10-CM | POA: Insufficient documentation

## 2012-05-18 DIAGNOSIS — R079 Chest pain, unspecified: Secondary | ICD-10-CM | POA: Insufficient documentation

## 2012-05-18 DIAGNOSIS — I2510 Atherosclerotic heart disease of native coronary artery without angina pectoris: Principal | ICD-10-CM | POA: Insufficient documentation

## 2012-05-18 DIAGNOSIS — F419 Anxiety disorder, unspecified: Secondary | ICD-10-CM | POA: Diagnosis present

## 2012-05-18 DIAGNOSIS — Z9861 Coronary angioplasty status: Secondary | ICD-10-CM | POA: Insufficient documentation

## 2012-05-18 DIAGNOSIS — I200 Unstable angina: Secondary | ICD-10-CM | POA: Insufficient documentation

## 2012-05-18 DIAGNOSIS — I10 Essential (primary) hypertension: Secondary | ICD-10-CM | POA: Insufficient documentation

## 2012-05-18 HISTORY — DX: Atherosclerotic heart disease of native coronary artery without angina pectoris: I25.10

## 2012-05-18 LAB — BASIC METABOLIC PANEL
Calcium: 9.5 mg/dL (ref 8.4–10.5)
GFR calc Af Amer: >90 mL/min (ref >90)
GFR calc non Af Amer: >90 mL/min (ref >90)
Glucose, Bld: 110 mg/dL — ABNORMAL HIGH (ref 70–99)
Sodium: 138 mEq/L (ref 135–145)

## 2012-05-18 LAB — CREATININE, SERUM
GFR calc Af Amer: >90 mL/min (ref >90)
GFR calc non Af Amer: >90 mL/min (ref >90)

## 2012-05-18 LAB — CBC
HCT: 46.8 % (ref 39.0–52.0)
Hemoglobin: 16.9 g/dL (ref 13.0–17.0)
RBC: 5.45 MIL/uL (ref 4.22–5.81)
WBC: 8.7 10*3/uL (ref 4.0–10.5)

## 2012-05-18 LAB — CBC WITH DIFFERENTIAL/PLATELET
Basophils Absolute: 0.1 10*3/uL (ref 0.0–0.1)
Basophils Relative: 1 % (ref 0–1)
Eosinophils Absolute: 0.1 10*3/uL (ref 0.0–0.7)
Eosinophils Relative: 1 % (ref 0–5)
Lymphs Abs: 1.4 10*3/uL (ref 0.7–4.0)
MCH: 30 pg (ref 26.0–34.0)
MCHC: 34.7 g/dL (ref 30.0–36.0)
MCV: 86.4 fL (ref 78.0–100.0)
Platelets: 165 10*3/uL (ref 150–400)
RDW: 12.6 % (ref 11.5–15.5)

## 2012-05-18 MED ORDER — ACETAMINOPHEN 325 MG PO TABS: 650.0000 mg | ORAL_TABLET | ORAL | Status: DC | PRN

## 2012-05-18 MED ORDER — HEPARIN SODIUM (PORCINE) 5000 UNIT/ML IJ SOLN
5000.0000 [IU] | Freq: Three times a day (TID) | INTRAMUSCULAR | Status: DC
  Filled 2012-05-18 (×5): qty 1

## 2012-05-18 MED ORDER — SODIUM CHLORIDE 0.9 % IJ SOLN: 3.0000 mL | INTRAMUSCULAR | Status: DC | PRN

## 2012-05-18 MED ORDER — SODIUM CHLORIDE 0.9 % IV SOLN: 250.0000 mL | INTRAVENOUS | Status: DC | PRN

## 2012-05-18 MED ORDER — ASPIRIN EC 81 MG PO TBEC
81.0000 mg | DELAYED_RELEASE_TABLET | Freq: Every day | ORAL | Status: DC
  Administered 2012-05-19: 81 mg via ORAL
  Filled 2012-05-18: qty 1

## 2012-05-18 MED ORDER — METOPROLOL TARTRATE 25 MG PO TABS
25.0000 mg | ORAL_TABLET | Freq: Two times a day (BID) | ORAL | Status: DC
  Administered 2012-05-19: 25 mg via ORAL
  Filled 2012-05-18: qty 1

## 2012-05-18 MED ORDER — ATORVASTATIN CALCIUM 80 MG PO TABS
80.0000 mg | ORAL_TABLET | Freq: Every day | ORAL | Status: DC
  Administered 2012-05-18: 80 mg via ORAL
  Filled 2012-05-18 (×2): qty 1

## 2012-05-18 MED ORDER — PRASUGREL HCL 10 MG PO TABS
10.0000 mg | ORAL_TABLET | Freq: Every day | ORAL | Status: DC
  Administered 2012-05-19: 10 mg via ORAL
  Filled 2012-05-18: qty 1

## 2012-05-18 MED ORDER — NITROGLYCERIN 0.4 MG SL SUBL: 0.4000 mg | SUBLINGUAL_TABLET | SUBLINGUAL | Status: DC | PRN

## 2012-05-18 MED ORDER — ONDANSETRON HCL 4 MG/2ML IJ SOLN: 4.0000 mg | Freq: Four times a day (QID) | INTRAMUSCULAR | Status: DC | PRN

## 2012-05-18 MED ORDER — SODIUM CHLORIDE 0.9 % IJ SOLN: 3.0000 mL | Freq: Two times a day (BID) | INTRAMUSCULAR | Status: DC

## 2012-05-18 MED ORDER — PANTOPRAZOLE SODIUM 40 MG PO TBEC
40.0000 mg | DELAYED_RELEASE_TABLET | Freq: Every day | ORAL | Status: DC
  Filled 2012-05-18: qty 1

## 2012-05-18 NOTE — ED Notes (Signed)
Pt c/o CP. Pt was in car had CP, diaphoresis and SOB; took 1 nitro w/ relief, denies pin at this time.

## 2012-05-18 NOTE — H&P (Signed)
HPI: 52 year old male for evaluation of chest pain. Patient had a myocardial infarction in October of 2011. He had 2 stents placed at [facility]. Approximately one year ago he apparently had chest pain which was attributed to indigestion. He typically does not have dyspnea on exertion, orthopnea, PND, pedal edema, palpitations, syncope or exertional chest pain. Today he had an episode of substernal chest pain that lasted approximately 10 minutes. It was described as a dull sensation without radiation. There was mild diaphoresis and a dizzy feeling. The pain was not pleuritic or positional. He states similar to his previous infarct pain but he also states the episode 1 year ago with similar. He is presently pain-free.   (Not in a hospital admission)  No Known Allergies  Past Medical History  Diagnosis Date  . GERD (gastroesophageal reflux disease)   . Hyperlipidemia   . Hypertension   . Myocardial infarction     october 2011 - went to [facility]  . Anxiety     no treated  . Stented coronary artery     Oct 2011 Dr. Chales Abrahams in [facility]    Past Surgical History  Procedure Date  . Hernia repair 08/18/2006    Bilateral Inguinal Hernias  . External ear surgery 1968  . Vasectomy 1995  . Inguinal hernia repair 12/17/2011    Procedure: HERNIA REPAIR INGUINAL ADULT;  Surgeon: Wilmon Arms. Corliss Skains, MD;  Location: [facility];  Service: General;  Laterality: Left;  left recurrent inguinal hernia repair     History   Social History  . Marital Status: Married    Spouse Name: N/A    Number of Children: 2  . Years of Education: N/A   Occupational History  . Curator    Social History Main Topics  . Smoking status: Former Games developer  . Smokeless tobacco: Former Neurosurgeon    Quit date: 11/21/1987  . Alcohol Use: No  . Drug Use: No  . Sexually Active:    Other Topics Concern  . Not on file   Social History Narrative  . No narrative on file    Family  History  Problem Relation Age of Onset  . CAD Father     CABG  . CAD Mother     CABG    ROS:  no fevers or chills, productive cough, hemoptysis, dysphasia, odynophagia, melena, hematochezia, dysuria, hematuria, rash, seizure activity, orthopnea, PND, pedal edema, claudication. Remaining systems are negative.  Physical Exam:   Blood pressure 129/85, pulse 58, temperature 98 F (36.7 C), temperature source Oral, resp. rate 15, SpO2 100.00%.  General:  Well developed/well nourished in NAD Skin warm/dry Patient not depressed No peripheral clubbing Back-normal HEENT-normal/normal eyelids Neck supple/normal carotid upstroke bilaterally; no bruits; no JVD; no thyromegaly chest - CTA/ normal expansion CV - RRR/normal S1 and S2; no murmurs, rubs or gallops;  PMI nondisplaced Abdomen -NT/ND, no HSM, no mass, + bowel sounds, no bruit 2+ femoral pulses, no bruits Ext-no edema, chords, 2+ DP Neuro-grossly nonfocal  ECG sinus rhythm with no ST changes.  Results for orders placed during the hospital encounter of 05/18/12 (from the past 48 hour(s))  CBC WITH DIFFERENTIAL     Status: Normal   Collection Time   05/18/12  1:28 PM      Component Value Range Comment   WBC 5.9  4.0 - 10.5 K/uL    RBC 5.30  4.22 - 5.81 MIL/uL    Hemoglobin 15.9  13.0 - 17.0 g/dL  HCT 45.8  39.0 - 52.0 %    MCV 86.4  78.0 - 100.0 fL    MCH 30.0  26.0 - 34.0 pg    MCHC 34.7  30.0 - 36.0 g/dL    RDW 29.5  62.1 - 30.8 %    Platelets 165  150 - 400 K/uL    Neutrophils Relative 67  43 - 77 %    Neutro Abs 4.0  1.7 - 7.7 K/uL    Lymphocytes Relative 23  12 - 46 %    Lymphs Abs 1.4  0.7 - 4.0 K/uL    Monocytes Relative 8  3 - 12 %    Monocytes Absolute 0.5  0.1 - 1.0 K/uL    Eosinophils Relative 1  0 - 5 %    Eosinophils Absolute 0.1  0.0 - 0.7 K/uL    Basophils Relative 1  0 - 1 %    Basophils Absolute 0.1  0.0 - 0.1 K/uL   BASIC METABOLIC PANEL     Status: Abnormal   Collection Time   05/18/12  1:28 PM       Component Value Range Comment   Sodium 138  135 - 145 mEq/L    Potassium 3.8  3.5 - 5.1 mEq/L    Chloride 99  96 - 112 mEq/L    CO2 30  19 - 32 mEq/L    Glucose, Bld 110 (*) 70 - 99 mg/dL    BUN 15  6 - 23 mg/dL    Creatinine, Ser 6.57  0.50 - 1.35 mg/dL    Calcium 9.5  8.4 - 84.6 mg/dL    GFR calc non Af Amer >90  >90 mL/min    GFR calc Af Amer >90  >90 mL/min   TROPONIN I     Status: Normal   Collection Time   05/18/12  1:30 PM      Component Value Range Comment   Troponin I <0.30  <0.30 ng/mL     Dg Chest 2 View  05/18/2012  *RADIOLOGY REPORT*  Clinical Data: Chest pain.  CHEST - 2 VIEW  Comparison: 02/19/2011.  Findings: Trachea is midline.  Heart size normal.  Lungs are clear. Biapical pleural thickening, unchanged.  No pleural fluid.  IMPRESSION: No acute findings.   Original Report Authenticated By: Leanna Battles, M.D.     Assessment/Plan  1 unstable angina-the patient presents with chest pain that he thinks is similar to his previous infarct pain. However he apparently was evaluated for a similar episode approximately one year ago that was felt to be indigestion. His electrocardiogram shows no ST changes. Initial enzymes negative. Admit and rule out myocardial infarction with serial enzymes. If negative proceed with functional study.  2 coronary disease-continue aspirin, effient, beta blocker and statin. 3 hypertension-continue present blood pressure medications. 4 hyperlipidemia-continue statin.  Olga Millers MD 05/18/2012, 4:43 PM

## 2012-05-18 NOTE — ED Provider Notes (Signed)
History     CSN: [id number]  Arrival date & time 05/18/12  1309   First MD Initiated Contact with Patient 05/18/12 1311      Chief Complaint  Patient presents with  . Chest Pain    (Consider location/radiation/quality/duration/timing/severity/associated sxs/prior treatment) HPI Comments: This is a 52 year old male, past medical history remarkable for previous MI, hypertension, and hyperlipidemia, who presents emergency department with chief complaint of chest pain. Patient states that this morning while driving his wife into work, she noticed a dull aching chest pain, which was associated with shortness of breath, clamminess, and dizziness. The pain radiated to his left arm. He states that the pain was 4/10. He tried taking one nitroglycerin, which resolved his symptoms.  He is followed by Dr. Chales Abrahams, with [facility], as he had a previous MI with 2 stents placed in August of 2010. He has not smoked for 20 years.  The history is provided by the patient. No language interpreter was used.    Past Medical History  Diagnosis Date  . GERD (gastroesophageal reflux disease)   . Hyperlipidemia   . Hypertension   . Heart attack     october 2011 - went to [facility]  . Anxiety     no treated  . Wears hearing aid     Bilateral  . Stented coronary artery     Oct 2011 Dr. Chales Abrahams in [facility]  . Wears glasses     Past Surgical History  Procedure Date  . Hernia repair 08/18/2006    Bilateral Inguinal Hernias  . External ear surgery 1968  . Vasectomy 1995  . Inguinal hernia repair 12/17/2011    Procedure: HERNIA REPAIR INGUINAL ADULT;  Surgeon: Wilmon Arms. Corliss Skains, MD;  Location: [facility];  Service: General;  Laterality: Left;  left recurrent inguinal hernia repair     No family history on file.  History  Substance Use Topics  . Smoking status: Former Games developer  . Smokeless tobacco: Former Neurosurgeon    Quit date: 11/21/1987  . Alcohol Use: No       Review of Systems  All other systems reviewed and are negative.    Allergies  Review of patient's allergies indicates no known allergies.  Home Medications   Current Outpatient Rx  Name  Route  Sig  Dispense  Refill  . ASPIRIN 81 MG PO TABS   Oral   Take 81 mg by mouth daily.         . ATORVASTATIN CALCIUM 80 MG PO TABS               . EFFIENT 10 MG PO TABS               . METOPROLOL TARTRATE 25 MG PO TABS               . NITROGLYCERIN 0.4 MG SL SUBL   Sublingual   Place 0.4 mg under the tongue as needed.         . OXYCODONE-ACETAMINOPHEN 5-325 MG PO TABS               . PANTOPRAZOLE SODIUM 40 MG PO TBEC   Oral   Take 40 mg by mouth daily.           BP 169/93  Pulse 57  Temp 98 F (36.7 C) (Oral)  Resp 18  SpO2 99%  Physical Exam  Nursing note and vitals reviewed. Constitutional: He is oriented to  person, place, and time. He appears well-developed and well-nourished.  HENT:  Head: Normocephalic and atraumatic.  Eyes: Conjunctivae normal and EOM are normal. Right eye exhibits no discharge. Left eye exhibits no discharge. No scleral icterus.  Neck: Normal range of motion. Neck supple. No JVD present.  Cardiovascular: Normal rate, regular rhythm, normal heart sounds and intact distal pulses.  Exam reveals no gallop and no friction rub.   No murmur heard. Pulmonary/Chest: Effort normal and breath sounds normal. No respiratory distress. He has no wheezes. He has no rales. He exhibits no tenderness.  Abdominal: Soft. Bowel sounds are normal. He exhibits no distension and no mass. There is no tenderness. There is no rebound and no guarding.  Musculoskeletal: Normal range of motion. He exhibits no edema and no tenderness.  Neurological: He is alert and oriented to person, place, and time. He has normal reflexes.  Skin: Skin is warm and dry.  Psychiatric: He has a normal mood and affect. His behavior is normal. Judgment and thought content  normal.    ED Course  Procedures (including critical care time)   Labs Reviewed  CBC WITH DIFFERENTIAL  BASIC METABOLIC PANEL  TROPONIN I   Results for orders placed during the hospital encounter of 05/18/12  CBC WITH DIFFERENTIAL      Component Value Range   WBC 5.9  4.0 - 10.5 K/uL   RBC 5.30  4.22 - 5.81 MIL/uL   Hemoglobin 15.9  13.0 - 17.0 g/dL   HCT 82.9  56.2 - 13.0 %   MCV 86.4  78.0 - 100.0 fL   MCH 30.0  26.0 - 34.0 pg   MCHC 34.7  30.0 - 36.0 g/dL   RDW 86.5  78.4 - 69.6 %   Platelets 165  150 - 400 K/uL   Neutrophils Relative 67  43 - 77 %   Neutro Abs 4.0  1.7 - 7.7 K/uL   Lymphocytes Relative 23  12 - 46 %   Lymphs Abs 1.4  0.7 - 4.0 K/uL   Monocytes Relative 8  3 - 12 %   Monocytes Absolute 0.5  0.1 - 1.0 K/uL   Eosinophils Relative 1  0 - 5 %   Eosinophils Absolute 0.1  0.0 - 0.7 K/uL   Basophils Relative 1  0 - 1 %   Basophils Absolute 0.1  0.0 - 0.1 K/uL  BASIC METABOLIC PANEL      Component Value Range   Sodium 138  135 - 145 mEq/L   Potassium 3.8  3.5 - 5.1 mEq/L   Chloride 99  96 - 112 mEq/L   CO2 30  19 - 32 mEq/L   Glucose, Bld 110 (*) 70 - 99 mg/dL   BUN 15  6 - 23 mg/dL   Creatinine, Ser 2.95  0.50 - 1.35 mg/dL   Calcium 9.5  8.4 - 28.4 mg/dL   GFR calc non Af Amer >90  >90 mL/min   GFR calc Af Amer >90  >90 mL/min  TROPONIN I      Component Value Range   Troponin I <0.30  <0.30 ng/mL   Dg Chest 2 View  05/18/2012  *RADIOLOGY REPORT*  Clinical Data: Chest pain.  CHEST - 2 VIEW  Comparison: 02/19/2011.  Findings: Trachea is midline.  Heart size normal.  Lungs are clear. Biapical pleural thickening, unchanged.  No pleural fluid.  IMPRESSION: No acute findings.   Original Report Authenticated By: Leanna Battles, M.D.  ED ECG REPORT  I personally interpreted this EKG   Date: 05/18/2012   Rate: 54  Rhythm: normal sinus rhythm  QRS Axis: normal  Intervals: normal  ST/T Wave abnormalities: normal  Conduction Disutrbances:none   Narrative Interpretation:   Old EKG Reviewed: unchanged    1. Chest pain       MDM  52 year old male with chest pain. Will obtain basic labs, EKG, and chest x-ray. Patient not having any pain at this time. His O2 saturation is 100% on room air. This sounds like unstable angina to me, but will discuss the patient with Dr. Ranae Palms.  2:48 PM Discuss the patient with Dr. Ranae Palms, who states that he will also see the patient personally. I have consulted cardiology, who will come and evaluate the patient.  3:42 PM Patient signed out to Johnnette Gourd, PA-C, who will discharge based on Cardiology's recommendations.        Roxy Horseman, PA-C 05/18/12 1544

## 2012-05-19 ENCOUNTER — Observation Stay (HOSPITAL_COMMUNITY): Payer: BC Managed Care – PPO

## 2012-05-19 ENCOUNTER — Encounter (HOSPITAL_COMMUNITY): Payer: Self-pay | Admitting: General Practice

## 2012-05-19 DIAGNOSIS — F419 Anxiety disorder, unspecified: Secondary | ICD-10-CM | POA: Diagnosis present

## 2012-05-19 DIAGNOSIS — I2510 Atherosclerotic heart disease of native coronary artery without angina pectoris: Secondary | ICD-10-CM

## 2012-05-19 DIAGNOSIS — R079 Chest pain, unspecified: Secondary | ICD-10-CM

## 2012-05-19 DIAGNOSIS — I10 Essential (primary) hypertension: Secondary | ICD-10-CM

## 2012-05-19 DIAGNOSIS — K219 Gastro-esophageal reflux disease without esophagitis: Secondary | ICD-10-CM | POA: Diagnosis present

## 2012-05-19 DIAGNOSIS — I200 Unstable angina: Secondary | ICD-10-CM

## 2012-05-19 DIAGNOSIS — E785 Hyperlipidemia, unspecified: Secondary | ICD-10-CM

## 2012-05-19 LAB — TSH: TSH: 2.811 u[IU]/mL (ref 0.350–4.500)

## 2012-05-19 MED ORDER — INFLUENZA VIRUS VACC SPLIT PF IM SUSP: 0.5000 mL | INTRAMUSCULAR | Status: DC

## 2012-05-19 MED ORDER — TECHNETIUM TC 99M SESTAMIBI GENERIC - CARDIOLITE
10.0000 | Freq: Once | INTRAVENOUS | Status: AC | PRN
  Administered 2012-05-19: 10 via INTRAVENOUS

## 2012-05-19 MED ORDER — TECHNETIUM TC 99M SESTAMIBI GENERIC - CARDIOLITE
30.0000 | Freq: Once | INTRAVENOUS | Status: AC | PRN
  Administered 2012-05-19: 30 via INTRAVENOUS

## 2012-05-19 NOTE — Progress Notes (Signed)
Patient ID: Shane Shea, male   DOB: [birth year], 52 y.o.   MRN: [id number]    SUBJECTIVE: No further chest pain.  Cardiac enzymes negative.  ECG non-acute.      Marland Kitchen aspirin EC  81 mg Oral Daily  . atorvastatin  80 mg Oral QHS  . heparin  5,000 Units Subcutaneous Q8H  . metoprolol tartrate  25 mg Oral BID  . pantoprazole  40 mg Oral Daily  . prasugrel  10 mg Oral Daily  . sodium chloride  3 mL Intravenous Q12H      Filed Vitals:   05/18/12 1840 05/18/12 2034 05/19/12 0429 05/19/12 0854  BP: 144/88 129/85 126/86 144/80  Pulse: 64 62 81 63  Temp: 98 F (36.7 C) 98.1 F (36.7 C) 98 F (36.7 C)   TempSrc: Oral Oral Oral   Resp: 20 18 18 16   Height: 5\' 10"  (1.778 m)     Weight: 171 lb 12.8 oz (77.928 kg)     SpO2: 100% 94% 92%    No intake or output data in the 24 hours ending 05/19/12 0858  LABS: Basic Metabolic Panel:  Basename 05/18/12 1858 05/18/12 1328  NA -- 138  K -- 3.8  CL -- 99  CO2 -- 30  GLUCOSE -- 110*  BUN -- 15  CREATININE 0.86 0.89  CALCIUM -- 9.5  MG -- --  PHOS -- --   Liver Function Tests: No results found for this basename: AST:2,ALT:2,ALKPHOS:2,BILITOT:2,PROT:2,ALBUMIN:2 in the last 72 hours No results found for this basename: LIPASE:2,AMYLASE:2 in the last 72 hours CBC:  Basename 05/18/12 1858 05/18/12 1328  WBC 8.7 5.9  NEUTROABS -- 4.0  HGB 16.9 15.9  HCT 46.8 45.8  MCV 85.9 86.4  PLT 188 165   Cardiac Enzymes:  Basename 05/19/12 0637 05/19/12 0026 05/18/12 1858  CKTOTAL -- -- --  CKMB -- -- --  CKMBINDEX -- -- --  TROPONINI <0.30 <0.30 <0.30   BNP: No components found with this basename: POCBNP:3 D-Dimer: No results found for this basename: DDIMER:2 in the last 72 hours Hemoglobin A1C: No results found for this basename: HGBA1C in the last 72 hours Fasting Lipid Panel: No results found for this basename: CHOL,HDL,LDLCALC,TRIG,CHOLHDL,LDLDIRECT in the last 72 hours Thyroid Function Tests:  Basename 05/18/12 1858  TSH  2.811  T4TOTAL --  T3FREE --  THYROIDAB --   Anemia Panel: No results found for this basename: VITAMINB12,FOLATE,FERRITIN,TIBC,IRON,RETICCTPCT in the last 72 hours  RADIOLOGY: Dg Chest 2 View  05/18/2012  *RADIOLOGY REPORT*  Clinical Data: Chest pain.  CHEST - 2 VIEW  Comparison: 02/19/2011.  Findings: Trachea is midline.  Heart size normal.  Lungs are clear. Biapical pleural thickening, unchanged.  No pleural fluid.  IMPRESSION: No acute findings.   Original Report Authenticated By: Leanna Battles, M.D.     PHYSICAL EXAM General: NAD Neck: No JVD, no thyromegaly or thyroid nodule.  Lungs: Clear to auscultation bilaterally with normal respiratory effort. CV: Nondisplaced PMI.  Heart regular S1/S2, no S3/S4, no murmur.  No peripheral edema.  No carotid bruit.  Normal pedal pulses.  Abdomen: Soft, nontender, no hepatosplenomegaly, no distention.  Neurologic: Alert and oriented x 3.  Psych: Normal affect. Extremities: No clubbing or cyanosis.   TELEMETRY: Reviewed telemetry pt in NSR  ASSESSMENT AND PLAN: 52 yo with history of CAD s/p PCI at [facility] presented with somewhat atypical chest pain.  Negative cardiac enzymes and non-acute ECG.  Will plan on ETT-Sestamibi this morning.  If negative, he  can go home this afternoon.   Marca Ancona 05/19/2012 8:59 AM

## 2012-05-19 NOTE — Progress Notes (Signed)
D/c orders received;IV removed with gauze on, pt remains in stable condition, pt meds and instructions reviewed and given to pt; pt d/c to home, per Ward Givens, NP, pt ok to drive himself home

## 2012-05-19 NOTE — Discharge Summary (Signed)
Patient ID: COLON RUETH,  MRN: [id number], DOB/AGE: 1960-05-17 52 y.o.  Admit date: 05/18/2012 Discharge date: 05/19/2012  Primary Care Provider: [facility] Primary Cardiologist: Dr. Chales Abrahams, [facility]  Discharge Diagnoses Principal Problem:  *Unstable angina  **S/P Negative Cardiolite this admission. Active Problems:  CAD (coronary artery disease)  **S/P prior MI 01/2010.  HTN (hypertension)  Hyperlipidemia  GERD (gastroesophageal reflux disease)  Anxiety  Allergies No Known Allergies  Procedures  Exercise Cardiolite 05/19/2012  Final interpretation:  Normal stress Myoview with no chest pain, no electrocardiographic changes, and the scintigraphic results show a small, mild, fixed inferobasal defect consistent with thinning. There is no ischemia.  The gated ejection fraction was 65% and the wall motion was normal.  History of Present Illness  52 year old male with prior history of CAD status post infarct in 2011 that was managed at [facility]. He was in his usual state of health until the day of admission when he developed substernal chest discomfort associated with mild diaphoresis and dizziness, lasting 10 minutes and resolving spontaneously. Patient felt discomfort was similar to prior anginal equivalent and presented to the emergency department where ECG was nonacute and initial cardiac markers were negative. He was admitted for further evaluation.  Hospital Course  Patient ruled out for myocardial infarction. Decision was made to pursue exercise Cardiolite which took place this morning revealing good exercise tolerance (9 minutes 27 seconds exercise time), without ST segment changes or chest pain. Imaging revealed no evidence of ischemia but a small, mild, fixed inferobasal defect consistent with thinning. EF was 65%. Patient will be discharged home this afternoon in good condition.  Discharge Vitals Blood pressure 138/91, pulse 83, temperature 98  F (36.7 C), temperature source Oral, resp. rate 16, height 5\' 10"  (1.778 m), weight 171 lb 12.8 oz (77.928 kg), SpO2 92.00%.  Filed Weights   05/18/12 1840  Weight: 171 lb 12.8 oz (77.928 kg)   Labs  CBC  Basename 05/18/12 1858 05/18/12 1328  WBC 8.7 5.9  NEUTROABS -- 4.0  HGB 16.9 15.9  HCT 46.8 45.8  MCV 85.9 86.4  PLT 188 165   Basic Metabolic Panel  Basename 05/18/12 1858 05/18/12 1328  NA -- 138  K -- 3.8  CL -- 99  CO2 -- 30  GLUCOSE -- 110*  BUN -- 15  CREATININE 0.86 0.89  CALCIUM -- 9.5  MG -- --  PHOS -- --   Cardiac Enzymes  Basename 05/19/12 4098 05/19/12 0026 05/18/12 1858  CKTOTAL -- -- --  CKMB -- -- --  CKMBINDEX -- -- --  TROPONINI <0.30 <0.30 <0.30   Thyroid Function Tests  Basename 05/18/12 1858  TSH 2.811  T4TOTAL --  T3FREE --  THYROIDAB --   Disposition  Pt is being discharged home today in good condition.  Follow-up Plans & Appointments      Follow-up Information    Follow up with [facility], MD. (1-2 wks)    Contact information:   [address] [place] [address] [phone]         Discharge Medications    Medication List     As of 05/19/2012  4:16 PM    TAKE these medications         aspirin EC 81 MG tablet   Take 81 mg by mouth daily.      atorvastatin 80 MG tablet   Commonly known as: LIPITOR   Take 80 mg by mouth at bedtime.  EFFIENT 10 MG Tabs   Generic drug: prasugrel   Take 10 mg by mouth daily.      metoprolol tartrate 25 MG tablet   Commonly known as: LOPRESSOR   Take 25 mg by mouth 2 (two) times daily.      nitroGLYCERIN 0.4 MG SL tablet   Commonly known as: NITROSTAT   Place 0.4 mg under the tongue every 5 (five) minutes as needed. For chest pain.      pantoprazole 40 MG tablet   Commonly known as: PROTONIX   Take 40 mg by mouth daily.        Outstanding Labs/Studies  None  Duration of Discharge Encounter   Greater than 30 minutes including physician  time.  Signed, Nicolasa Ducking NP 05/19/2012, 4:16 PM

## 2012-05-19 NOTE — Progress Notes (Signed)
Utilization review completed.  

## 2012-05-20 NOTE — ED Provider Notes (Signed)
Medical screening examination/treatment/procedure(s) were conducted as a shared visit with non-physician practitioner(s) and myself.  I personally evaluated the patient during the encounter   Loren Racer, MD 05/20/12 2234

## 2012-06-13 ENCOUNTER — Other Ambulatory Visit: Payer: Self-pay

## 2013-03-04 ENCOUNTER — Other Ambulatory Visit: Payer: Self-pay

## 2013-08-20 ENCOUNTER — Emergency Department (HOSPITAL_COMMUNITY): Payer: BC Managed Care – PPO

## 2013-08-20 ENCOUNTER — Emergency Department (HOSPITAL_COMMUNITY)
Admission: EM | Admit: 2013-08-20 | Discharge: 2013-08-20 | Disposition: A | Discharge status: Home / Self Care | Payer: BC Managed Care – PPO | Attending: Emergency Medicine | Admitting: Emergency Medicine

## 2013-08-20 ENCOUNTER — Encounter (HOSPITAL_COMMUNITY): Payer: Self-pay | Admitting: Emergency Medicine

## 2013-08-20 ENCOUNTER — Other Ambulatory Visit: Payer: Self-pay

## 2013-08-20 DIAGNOSIS — E785 Hyperlipidemia, unspecified: Secondary | ICD-10-CM

## 2013-08-20 DIAGNOSIS — Z79899 Other long term (current) drug therapy: Secondary | ICD-10-CM | POA: Insufficient documentation

## 2013-08-20 DIAGNOSIS — R079 Chest pain, unspecified: Secondary | ICD-10-CM

## 2013-08-20 DIAGNOSIS — Z7982 Long term (current) use of aspirin: Secondary | ICD-10-CM | POA: Insufficient documentation

## 2013-08-20 DIAGNOSIS — I200 Unstable angina: Secondary | ICD-10-CM

## 2013-08-20 DIAGNOSIS — Z8659 Personal history of other mental and behavioral disorders: Secondary | ICD-10-CM | POA: Insufficient documentation

## 2013-08-20 DIAGNOSIS — I2510 Atherosclerotic heart disease of native coronary artery without angina pectoris: Secondary | ICD-10-CM

## 2013-08-20 DIAGNOSIS — I10 Essential (primary) hypertension: Secondary | ICD-10-CM

## 2013-08-20 DIAGNOSIS — Z87891 Personal history of nicotine dependence: Secondary | ICD-10-CM | POA: Insufficient documentation

## 2013-08-20 DIAGNOSIS — K219 Gastro-esophageal reflux disease without esophagitis: Secondary | ICD-10-CM | POA: Insufficient documentation

## 2013-08-20 LAB — BASIC METABOLIC PANEL
BUN: 14 mg/dL (ref 6–23)
CALCIUM: 9.3 mg/dL (ref 8.4–10.5)
CHLORIDE: 103 meq/L (ref 96–112)
CO2: 29 meq/L (ref 19–32)
Creatinine, Ser: 0.84 mg/dL (ref 0.50–1.35)
GFR calc Af Amer: >90 mL/min (ref >90)
GFR calc non Af Amer: >90 mL/min (ref >90)
Glucose, Bld: 104 mg/dL — ABNORMAL HIGH (ref 70–99)
POTASSIUM: 3.9 meq/L (ref 3.7–5.3)
SODIUM: 143 meq/L (ref 137–147)

## 2013-08-20 LAB — CBC
HCT: 42.6 % (ref 39.0–52.0)
Hemoglobin: 14.8 g/dL (ref 13.0–17.0)
MCH: 30.5 pg (ref 26.0–34.0)
MCHC: 34.7 g/dL (ref 30.0–36.0)
MCV: 87.7 fL (ref 78.0–100.0)
PLATELETS: 186 10*3/uL (ref 150–400)
RBC: 4.86 MIL/uL (ref 4.22–5.81)
RDW: 12.5 % (ref 11.5–15.5)
WBC: 7.5 10*3/uL (ref 4.0–10.5)

## 2013-08-20 LAB — I-STAT TROPONIN, ED
TROPONIN I, POC: 0 ng/mL (ref 0.00–0.08)
TROPONIN I, POC: 0 ng/mL (ref 0.00–0.08)

## 2013-08-20 LAB — PRO B NATRIURETIC PEPTIDE: PRO B NATRI PEPTIDE: 58.8 pg/mL (ref 0–125)

## 2013-08-20 NOTE — Discharge Instructions (Signed)
Chest Pain (Nonspecific) °It is often hard to give a specific diagnosis for the cause of chest pain. There is always a chance that your pain could be related to something serious, such as a heart attack or a blood clot in the lungs. You need to follow up with your caregiver for further evaluation. °CAUSES  °· Heartburn. °· Pneumonia or bronchitis. °· Anxiety or stress. °· Inflammation around your heart (pericarditis) or lung (pleuritis or pleurisy). °· A blood clot in the lung. °· A collapsed lung (pneumothorax). It can develop suddenly on its own (spontaneous pneumothorax) or from injury (trauma) to the chest. °· Shingles infection (herpes zoster virus). °The chest wall is composed of bones, muscles, and cartilage. Any of these can be the source of the pain. °· The bones can be bruised by injury. °· The muscles or cartilage can be strained by coughing or overwork. °· The cartilage can be affected by inflammation and become sore (costochondritis). °DIAGNOSIS  °Lab tests or other studies, such as X-rays, electrocardiography, stress testing, or cardiac imaging, may be needed to find the cause of your pain.  °TREATMENT  °· Treatment depends on what may be causing your chest pain. Treatment may include: °· Acid blockers for heartburn. °· Anti-inflammatory medicine. °· Pain medicine for inflammatory conditions. °· Antibiotics if an infection is present. °· You may be advised to change lifestyle habits. This includes stopping smoking and avoiding alcohol, caffeine, and chocolate. °· You may be advised to keep your head raised (elevated) when sleeping. This reduces the chance of acid going backward from your stomach into your esophagus. °· Most of the time, nonspecific chest pain will improve within 2 to 3 days with rest and mild pain medicine. °HOME CARE INSTRUCTIONS  °· If antibiotics were prescribed, take your antibiotics as directed. Finish them even if you start to feel better. °· For the next few days, avoid physical  activities that bring on chest pain. Continue physical activities as directed. °· Do not smoke. °· Avoid drinking alcohol. °· Only take over-the-counter or prescription medicine for pain, discomfort, or fever as directed by your caregiver. °· Follow your caregiver's suggestions for further testing if your chest pain does not go away. °· Keep any follow-up appointments you made. If you do not go to an appointment, you could develop lasting (chronic) problems with pain. If there is any problem keeping an appointment, you must call to reschedule. °SEEK MEDICAL CARE IF:  °· You think you are having problems from the medicine you are taking. Read your medicine instructions carefully. °· Your chest pain does not go away, even after treatment. °· You develop a rash with blisters on your chest. °SEEK IMMEDIATE MEDICAL CARE IF:  °· You have increased chest pain or pain that spreads to your arm, neck, jaw, back, or abdomen. °· You develop shortness of breath, an increasing cough, or you are coughing up blood. °· You have severe back or abdominal pain, feel nauseous, or vomit. °· You develop severe weakness, fainting, or chills. °· You have a fever. °THIS IS AN EMERGENCY. Do not wait to see if the pain will go away. Get medical help at once. Call your local emergency services (911 in [place] Do not drive yourself to the hospital. °MAKE SURE YOU:  °· Understand these instructions. °· Will watch your condition. °· Will get help right away if you are not doing well or get worse. °Document Released: 01/23/2005 Document Revised: 07/08/2011 Document Reviewed: 11/19/2007 °Exit[employer]® Patient Information ©2014 [employer]   [employer]. ° °

## 2013-08-20 NOTE — Consult Note (Signed)
Reason for Consult: Chest pain, history of CAD  Requesting Physician: Silverio LayYao  Cardiologist: Constance HawZan Tyson, MD  HPI: This is a 53 y.o. male with a past medical history significant for acute myocardial infarction in 2011. He reports receiving 2 stents to the LAD artery which was totally occluded, but he had very little injury since the reperfusion occurred very quickly. His usual cardiologist is [name] in [facility] and he has an upcoming appointment with him shortly. His last evaluation for coronary disease was a nuclear stress test performed in January of 2014: This was a low risk study without ischemia and with preserved left ventricular function.  Today after eating lunch, he had chest discomfort that was reminiscent of his previous infarction pain, although he thinks it might have been related to difficulty swallowing a tablet the previous evening. It did not immediately resolve after taking an antacid and walking around, as his heartburn usually does. He became very anxious and his heart started racing. He took a couple of sublingual nitroglycerin tablets that provided only partial relief. The pain persisted for a long time afterwards, but eventually resolved spontaneously.   At this point he feels completely asymptomatic. He has walked to get an x-ray in radiology and his symptoms did not recur. 2 cardiac enzyme sets have been normal. His electrocardiogram is completely normal showing no signs of the old infarction or of any acute ischemia.  PMHx:  Past Medical History  Diagnosis Date  . GERD (gastroesophageal reflux disease)   . Hyperlipidemia   . Hypertension   . Anxiety     no treated  . CAD (coronary artery disease)     a. 01/2010 MI and PCI - [name] in [facility];  b. 04/2012 Negative Ex Cardiolite.   Past Surgical History  Procedure Laterality Date  . Hernia repair  08/18/2006    Bilateral Inguinal Hernias  . External ear surgery  1968  . Vasectomy  1995    . Inguinal hernia repair  12/17/2011    Procedure: HERNIA REPAIR INGUINAL ADULT;  Surgeon: [name], MD;  Location: [facility];  Service: General;  Laterality: Left;  left recurrent inguinal hernia repair     FAMHx: Family History  Problem Relation Age of Onset  . CAD Father     CABG  . CAD Mother     CABG    SOCHx:  reports that he quit smoking about 26 years ago. He quit smokeless tobacco use about 25 years ago. He reports that he does not drink alcohol or use illicit drugs.  ALLERGIES: No Known Allergies  ROS: Constitutional: negative for anorexia, chills, fevers, sweats and weight loss Eyes: negative Ears, nose, mouth, throat, and face: negative Respiratory: negative for cough, dyspnea on exertion, hemoptysis, sputum and wheezing Cardiovascular: positive for chest pressure/discomfort, negative for claudication, irregular heart beat, lower extremity edema, orthopnea, paroxysmal nocturnal dyspnea and syncope Gastrointestinal: positive for odynophagia and reflux symptoms, negative for change in bowel habits, jaundice, melena, nausea and vomiting Genitourinary:negative Integument/breast: negative Hematologic/lymphatic: negative for bleeding, easy bruising and petechiae Musculoskeletal:negative for arthralgias, back pain, muscle weakness and neck pain Neurological: negative for coordination problems, dizziness, gait problems, headaches, memory problems, seizures, vertigo and weakness Behavioral/Psych: negative Endocrine: negative Allergic/Immunologic: negative  HOME MEDICATIONS:  (Not in a hospital admission)  HOSPITAL MEDICATIONS: I have reviewed the patient's current medications.  VITALS: Blood pressure 128/80, pulse 56, temperature 98.3 F (36.8 C), temperature source Oral, resp. rate  17, SpO2 97.00%.  PHYSICAL EXAM:  General: Alert, oriented x3, no distress Head: no evidence of trauma, PERRL, EOMI, no exophtalmos or lid lag, no myxedema, no  xanthelasma; normal ears, nose and oropharynx Neck: Normal jugular venous pulsations and no hepatojugular reflux; brisk carotid pulses without delay and no carotid bruits Chest: clear to auscultation, no signs of consolidation by percussion or palpation, normal fremitus, symmetrical and full respiratory excursions Cardiovascular: normal position and quality of the apical impulse, regular rhythm, normal first heart sound and normal second heart sound, no rubs or gallops, no murmur Abdomen: no tenderness or distention, no masses by palpation, no abnormal pulsatility or arterial bruits, normal bowel sounds, no hepatosplenomegaly Extremities: no clubbing, cyanosis;  no edema; 2+ radial, ulnar and brachial pulses bilaterally; 2+ right femoral, posterior tibial and dorsalis pedis pulses; 2+ left femoral, posterior tibial and dorsalis pedis pulses; no subclavian or femoral bruits Neurological: grossly nonfocal   LABS  CBC  Recent Labs  08/20/13 1601  WBC 7.5  HGB 14.8  HCT 42.6  MCV 87.7  PLT 186   Basic Metabolic Panel  Recent Labs  08/20/13 1601  NA 143  K 3.9  CL 103  CO2 29  GLUCOSE 104*  BUN 14  CREATININE 0.84  CALCIUM 9.3    IMAGING: Dg Chest 2 View  08/20/2013   CLINICAL DATA:  Chest pain.  EXAM: CHEST  2 VIEW  COMPARISON:  05/18/2012  FINDINGS: The heart size and mediastinal contours are within normal limits. Both lungs are clear. The visualized skeletal structures are unremarkable.  IMPRESSION: No active cardiopulmonary disease.   Electronically Signed   By: Charlett NoseKevin  Dover M.D.   On: 08/20/2013 17:01    ECG: Normal sinus rhythm, normal  TELEMETRY:  no arrhythmia  IMPRESSION: 1. Atypical chest pain now resolved. Low risk ECG and biochemical findings. Likelihood of full blown acute coronary syndrome in the next 2 weeks is low 2. History of CAD and previous myocardial infarctions status post stents to LAD roughly 4 years earlier 3. Treated hypertension and  hyperlipidemia  RECOMMENDATION: 1. He is already on antiplatelet, high potency statin and beta blocker therapy. These should be continued. 2. He has an upcoming appointment with his cardiologist, [name]. It is not unreasonable to pursue a treadmill stress test in the next few days, but I will leave that up to [name]. 3. Return to the emergency room for symptoms lasting more than 30 minutes, not relieved by sublingual nitroglycerin  Time Spent Directly with Patient: 45 minutes  Thurmon FairMihai Trinh Sanjose, MD, [facility] [phone] office [phone] pager   08/20/2013, 7:40 PM

## 2013-08-20 NOTE — ED Notes (Signed)
Cardiology at bedside. Per Cardiology pt is appropriate for discharge. PT resting comfortably in bed. Continues to deny pain. VSS.

## 2013-08-20 NOTE — ED Provider Notes (Signed)
CSN: [id number]     Arrival date & time 08/20/13  1547 History   First MD Initiated Contact with Patient 08/20/13 1600     Chief Complaint  Patient presents with  . Chest Pain     (Consider location/radiation/quality/duration/timing/severity/associated sxs/prior Treatment) HPI Comments: Shane Shea is a 53 y.o. Male with a history of MI, htn, hyperlipidemia and GERD, presenting with midsternal chest pressure which started 4 hours before arrival. He states he cannot tell the difference between his heartburn and MI as his MI in 2011 presented with similar symptoms.   He had was sitting at his desk, had just finished his lunch when his pain started. He got up and walked around after taking a Tums,as he has frequent heartburn which did not resolve the pain.  He started to become lightheaded, so he sat down at which time he believes he started to panic a little and his heart started racing,also at this time felt mild sob.  The pressure persisted until he was given his second ntg by ems who transported him.  He also received asa 324 mg prior to arrival.  He still has slight pressure sensation mid sternal which is only triggered by swallowing.  He denies shortness of breath, diaphoresis, nausea or vomiting. He had no radiation of pain.  He is followed by [name], cardiologist in [place].  He reports he last had a cardiac stress test 1/14 which was normal.     The history is provided by the patient.    Past Medical History  Diagnosis Date  . GERD (gastroesophageal reflux disease)   . Hyperlipidemia   . Hypertension   . Anxiety     no treated  . CAD (coronary artery disease)     a. 01/2010 MI and PCI - [name];  b. 04/2012 Negative Ex Cardiolite.   Past Surgical History  Procedure Laterality Date  . Hernia repair  08/18/2006    Bilateral Inguinal Hernias  . External ear surgery  1968  . Vasectomy  1995  . Inguinal hernia repair  12/17/2011    Procedure: HERNIA  REPAIR INGUINAL ADULT;  Surgeon: [name], MD;  Location: [facility];  Service: General;  Laterality: Left;  left recurrent inguinal hernia repair    Family History  Problem Relation Age of Onset  . CAD Father     CABG  . CAD Mother     CABG   History  Substance Use Topics  . Smoking status: Former Smoker    Quit date: 05/20/1987  . Smokeless tobacco: Former NeurosurgeonUser    Quit date: 11/21/1987  . Alcohol Use: No    Review of Systems  Constitutional: Negative for fever and chills.  HENT: Negative for congestion and sore throat.   Eyes: Negative.   Respiratory: Negative for chest tightness, shortness of breath, wheezing and stridor.   Cardiovascular: Positive for chest pain.  Gastrointestinal: Negative for nausea and abdominal pain.  Genitourinary: Negative.   Musculoskeletal: Negative for arthralgias, joint swelling and neck pain.  Skin: Negative.  Negative for rash and wound.  Neurological: Negative for dizziness, weakness, light-headedness, numbness and headaches.  Psychiatric/Behavioral: Negative.       Allergies  Review of patient's allergies indicates no known allergies.  Home Medications   Prior to Admission medications   Medication Sig Start Date End Date Taking? Authorizing Provider  aspirin EC 81 MG tablet Take 81 mg by mouth daily.    Historical Provider,  MD  atorvastatin (LIPITOR) 80 MG tablet Take 80 mg by mouth at bedtime.  10/07/11   Historical Provider, MD  EFFIENT 10 MG TABS Take 10 mg by mouth daily.  10/30/11   Historical Provider, MD  metoprolol tartrate (LOPRESSOR) 25 MG tablet Take 25 mg by mouth 2 (two) times daily.  10/08/11   Historical Provider, MD  nitroGLYCERIN (NITROSTAT) 0.4 MG SL tablet Place 0.4 mg under the tongue every 5 (five) minutes as needed. For chest pain.    Historical Provider, MD  pantoprazole (PROTONIX) 40 MG tablet Take 40 mg by mouth daily.    Historical Provider, MD   BP 128/80  Pulse 56  Temp(Src) 98.3 F  (36.8 C) (Oral)  Resp 17  SpO2 97% Physical Exam  Nursing note and vitals reviewed. Constitutional: He appears well-developed and well-nourished.  HENT:  Head: Normocephalic and atraumatic.  Eyes: Conjunctivae are normal.  Neck: Normal range of motion.  Cardiovascular: Normal rate, regular rhythm, normal heart sounds and intact distal pulses.   Pulmonary/Chest: Effort normal and breath sounds normal. No respiratory distress. He has no wheezes. He exhibits no tenderness.  Abdominal: Soft. Bowel sounds are normal. There is no tenderness.  Musculoskeletal: Normal range of motion. He exhibits no edema and no tenderness.  Neurological: He is alert.  Skin: Skin is warm and dry.  Psychiatric: He has a normal mood and affect.    ED Course  Procedures (including critical care time) Labs Review Labs Reviewed  BASIC METABOLIC PANEL - Abnormal; Notable for the following:    Glucose, Bld 104 (*)    All other components within normal limits  CBC  PRO B NATRIURETIC PEPTIDE  I-STAT TROPOININ, ED  I-STAT TROPOININ, ED  Rosezena SensorI-STAT TROPOININ, ED    Imaging Review Dg Chest 2 View  08/20/2013   CLINICAL DATA:  Chest pain.  EXAM: CHEST  2 VIEW  COMPARISON:  05/18/2012  FINDINGS: The heart size and mediastinal contours are within normal limits. Both lungs are clear. The visualized skeletal structures are unremarkable.  IMPRESSION: No active cardiopulmonary disease.   Electronically Signed   By: Charlett NoseKevin  Dover M.D.   On: 08/20/2013 17:01     EKG Interpretation   Date/Time:  Friday August 20 2013 15:53:55 EDT Ventricular Rate:  61 PR Interval:  154 QRS Duration: 68 QT Interval:  388 QTC Calculation: 390 R Axis:   -2 Text Interpretation:  Normal sinus rhythm Normal ECG No significant change  since last tracing Confirmed by YAO  MD, DAVID ([id number]) on 08/20/2013  6:35:51 PM      MDM   Final diagnoses:  Chest pain   7:59 PM Pt pain free at this time.  Labs negative.  He is desirous of going  home,  Feels sx were probably related to [place] and eating acidic tomatoes at lunch.  Will order second troponin this time.  Review of chart indicates pt had an admission here 1/14 with similar sx,  Stress cardiolyte at that time was negative.  Will Consult [facility] and d/c home for outpatient followup with his cardiologist if felt appropriate and second troponin is negative.   Pt was seen by Dr. Royann Shiversroitoru of cardiology and felt stable for dc home and f/u with [name].     Burgess AmorJulie Dougles Kimmey, PA-C 08/20/13 1959  Burgess AmorJulie Baani Bober, PA-C 08/20/13 2003

## 2013-08-20 NOTE — ED Notes (Signed)
Patient taken xray.

## 2013-08-20 NOTE — ED Provider Notes (Signed)
Medical screening examination/treatment/procedure(s) were performed by non-physician practitioner and as supervising physician I was immediately available for consultation/collaboration.   EKG Interpretation   Date/Time:  Friday August 20 2013 15:53:55 EDT Ventricular Rate:  61 PR Interval:  154 QRS Duration: 68 QT Interval:  388 QTC Calculation: 390 R Axis:   -2 Text Interpretation:  Normal sinus rhythm Normal ECG No significant change  since last tracing Confirmed by Loreal Schuessler  MD, Vonette Grosso ([id number]) on 08/20/2013  6:35:51 PM        Richardean Canalavid H Asheton Viramontes, MD 08/20/13 2006

## 2013-08-20 NOTE — ED Notes (Signed)
Per [facility]: Pt at work 4/10 sudden onset of non-radiating central chest pressure. Pt given 325 aspirin and 2 nitro that relieved pain. Pt denies pain at this time. Reports SOB  (worse with excertion) with CP and lightheadedness, but denies N/V/diaphoresis. 129/84. 66 NSR; EKG unremarkable. 98% RA.

## 2013-08-29 IMAGING — CR DG CHEST 1V PORT
1 series · 1 of 1 positions shown · non-contrast
Comparison: Portable exam 9572 hours without priors for comparison.

CLINICAL DATA: Chest pain

PORTABLE CHEST - 1 VIEW

[view not recorded]
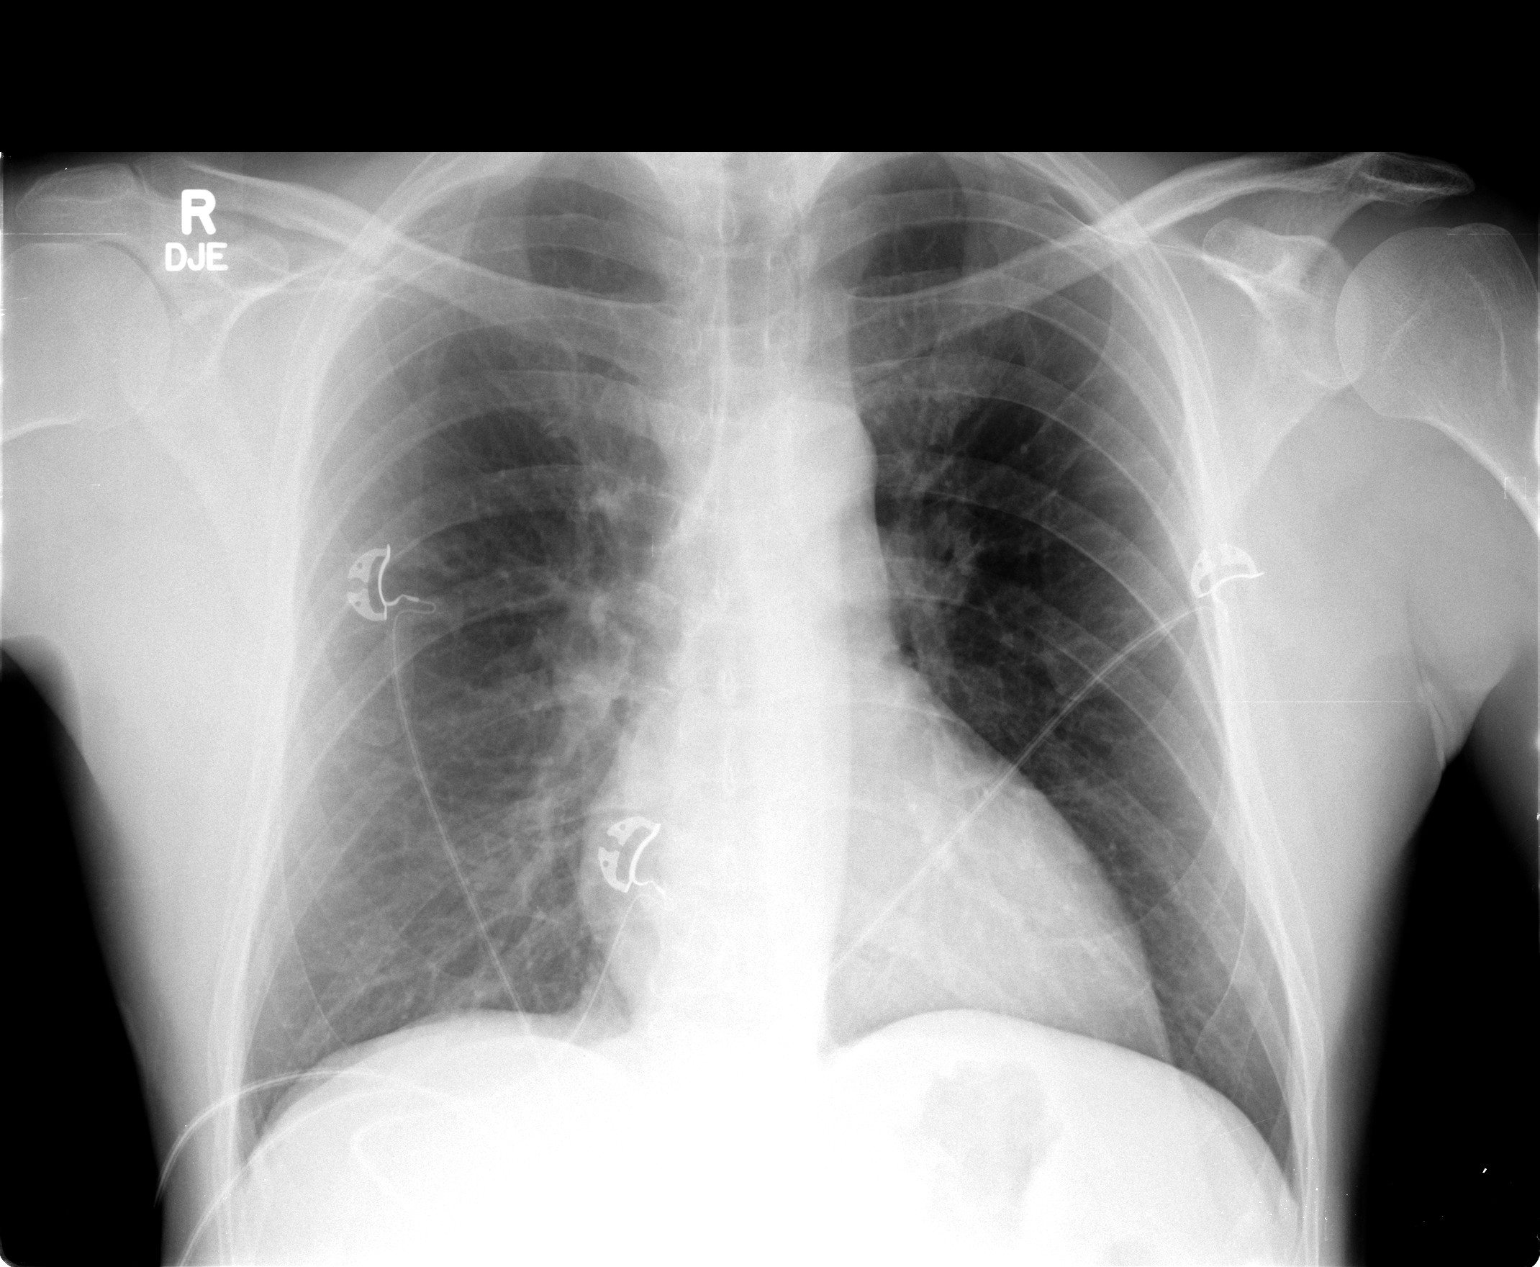

[1 of 1 positions shown; findings below may reference images not displayed]

FINDINGS: Upper-normal size of cardiac silhouette.
Mediastinal contours and pulmonary vascularity normal.
Lungs appear mildly emphysematous but clear.
No pleural effusion or pneumothorax.
Bones unremarkable.
IMPRESSION: Suspect emphysematous changes.
No acute abnormalities.

## 2014-02-22 ENCOUNTER — Emergency Department (HOSPITAL_COMMUNITY)
Admission: EM | Admit: 2014-02-22 | Discharge: 2014-02-22 | Disposition: A | Discharge status: Home / Self Care | Payer: BC Managed Care – PPO | Attending: Emergency Medicine | Admitting: Emergency Medicine

## 2014-02-22 ENCOUNTER — Encounter (HOSPITAL_COMMUNITY): Payer: Self-pay | Admitting: Emergency Medicine

## 2014-02-22 ENCOUNTER — Emergency Department (HOSPITAL_COMMUNITY): Payer: BC Managed Care – PPO

## 2014-02-22 DIAGNOSIS — Z79899 Other long term (current) drug therapy: Secondary | ICD-10-CM | POA: Insufficient documentation

## 2014-02-22 DIAGNOSIS — R079 Chest pain, unspecified: Secondary | ICD-10-CM

## 2014-02-22 DIAGNOSIS — I10 Essential (primary) hypertension: Secondary | ICD-10-CM | POA: Diagnosis not present

## 2014-02-22 DIAGNOSIS — Z8659 Personal history of other mental and behavioral disorders: Secondary | ICD-10-CM | POA: Diagnosis not present

## 2014-02-22 DIAGNOSIS — Z87891 Personal history of nicotine dependence: Secondary | ICD-10-CM | POA: Insufficient documentation

## 2014-02-22 DIAGNOSIS — I2510 Atherosclerotic heart disease of native coronary artery without angina pectoris: Secondary | ICD-10-CM | POA: Diagnosis not present

## 2014-02-22 DIAGNOSIS — Z7982 Long term (current) use of aspirin: Secondary | ICD-10-CM | POA: Insufficient documentation

## 2014-02-22 DIAGNOSIS — I252 Old myocardial infarction: Secondary | ICD-10-CM | POA: Insufficient documentation

## 2014-02-22 DIAGNOSIS — E785 Hyperlipidemia, unspecified: Secondary | ICD-10-CM | POA: Insufficient documentation

## 2014-02-22 DIAGNOSIS — R0789 Other chest pain: Secondary | ICD-10-CM | POA: Insufficient documentation

## 2014-02-22 DIAGNOSIS — K219 Gastro-esophageal reflux disease without esophagitis: Secondary | ICD-10-CM | POA: Insufficient documentation

## 2014-02-22 HISTORY — DX: Acute myocardial infarction, unspecified: I21.9

## 2014-02-22 LAB — CBC
HCT: 47 % (ref 39.0–52.0)
HEMOGLOBIN: 16 g/dL (ref 13.0–17.0)
MCH: 29.5 pg (ref 26.0–34.0)
MCHC: 34 g/dL (ref 30.0–36.0)
MCV: 86.7 fL (ref 78.0–100.0)
Platelets: 193 10*3/uL (ref 150–400)
RBC: 5.42 MIL/uL (ref 4.22–5.81)
RDW: 12.6 % (ref 11.5–15.5)
WBC: 7.6 10*3/uL (ref 4.0–10.5)

## 2014-02-22 LAB — BASIC METABOLIC PANEL
Anion gap: 17 — ABNORMAL HIGH (ref 5–15)
BUN: 12 mg/dL (ref 6–23)
CALCIUM: 9.7 mg/dL (ref 8.4–10.5)
CO2: 26 mEq/L (ref 19–32)
Chloride: 99 mEq/L (ref 96–112)
Creatinine, Ser: 0.92 mg/dL (ref 0.50–1.35)
GFR calc Af Amer: >90 mL/min (ref >90)
GFR calc non Af Amer: >90 mL/min (ref >90)
GLUCOSE: 92 mg/dL (ref 70–99)
Potassium: 3.8 mEq/L (ref 3.7–5.3)
Sodium: 142 mEq/L (ref 137–147)

## 2014-02-22 LAB — I-STAT TROPONIN, ED: TROPONIN I, POC: 0 ng/mL (ref 0.00–0.08)

## 2014-02-22 MED ORDER — FAMOTIDINE 20 MG PO TABS
20.0000 mg | ORAL_TABLET | Freq: Once | ORAL | Status: AC
  Administered 2014-02-22: 20 mg via ORAL
  Filled 2014-02-22: qty 1

## 2014-02-22 MED ORDER — FAMOTIDINE 20 MG PO TABS: 20.0000 mg | ORAL_TABLET | Freq: Two times a day (BID) | ORAL | Status: AC

## 2014-02-22 NOTE — ED Provider Notes (Signed)
CSN: [id number]     Arrival date & time 02/22/14  1750 History   First MD Initiated Contact with Patient 02/22/14 1815     Chief Complaint  Patient presents with  . Chest Pain     (Consider location/radiation/quality/duration/timing/severity/associated sxs/prior Treatment) HPI The patient reports he has had chest pain as waxing and waning for 3 days duration now. It's a pressure in the center of his chest and an aching quality. The patient reports that he does not have any associated symptoms. He reports he's tried nitroglycerin periodically without any change in the symptoms. It has been coming and going. The patient denies that it gets any worse or is precipitated by exertion. He reports he has had similar pain in the past and evaluated with a stress test. He reports at that point time it was not heart related pain. He has otherwise been feeling well and going about his usual activities.  Past Medical History  Diagnosis Date  . GERD (gastroesophageal reflux disease)   . Hyperlipidemia   . Hypertension   . Anxiety     no treated  . CAD (coronary artery disease)     a. 01/2010 MI and PCI - [name] in [facility];  b. 04/2012 Negative Ex Cardiolite.  . Myocardial infarct    Past Surgical History  Procedure Laterality Date  . Hernia repair  08/18/2006    Bilateral Inguinal Hernias  . External ear surgery  1968  . Vasectomy  1995  . Inguinal hernia repair  12/17/2011    Procedure: HERNIA REPAIR INGUINAL ADULT;  Surgeon: [name], MD;  Location: [facility];  Service: General;  Laterality: Left;  left recurrent inguinal hernia repair   . Coronary angioplasty with stent placement     Family History  Problem Relation Age of Onset  . CAD Father     CABG  . CAD Mother     CABG   History  Substance Use Topics  . Smoking status: Former Smoker    Quit date: 05/20/1987  . Smokeless tobacco: Former NeurosurgeonUser    Quit date: 11/21/1987  . Alcohol Use: No     Review of Systems 10 Systems reviewed and are negative for acute change except as noted in the HPI.    Allergies  Review of patient's allergies indicates no known allergies.  Home Medications   Prior to Admission medications   Medication Sig Start Date End Date Taking? Authorizing Provider  aspirin EC 81 MG tablet Take 81 mg by mouth daily.   Yes Historical Provider, MD  atorvastatin (LIPITOR) 40 MG tablet Take 40 mg by mouth every evening.   Yes Historical Provider, MD  calcium carbonate (TUMS - DOSED IN MG ELEMENTAL CALCIUM) 500 MG chewable tablet Chew 2 tablets by mouth daily as needed for indigestion or heartburn (indigestion).    Yes Historical Provider, MD  EFFIENT 10 MG TABS Take 10 mg by mouth daily.  10/30/11  Yes Historical Provider, MD  metoprolol tartrate (LOPRESSOR) 25 MG tablet Take 25 mg by mouth 2 (two) times daily.  10/08/11  Yes Historical Provider, MD  nitroGLYCERIN (NITROSTAT) 0.4 MG SL tablet Place 0.4 mg under the tongue every 5 (five) minutes as needed. For chest pain.   Yes Historical Provider, MD  OVER THE COUNTER MEDICATION Place 1 spray into right nostril daily as needed (dryness). Saline spray to prevent nose bleeds   Yes Historical Provider, MD   BP 138/80  Pulse 71  Temp(Src)  97.3 F (36.3 C) (Oral)  Resp 18  SpO2 97% Physical Exam  Constitutional: He is oriented to person, place, and time. He appears well-developed and well-nourished.  HENT:  Head: Normocephalic and atraumatic.  Eyes: EOM are normal. Pupils are equal, round, and reactive to light.  Neck: Neck supple.  Cardiovascular: Normal rate, regular rhythm, normal heart sounds and intact distal pulses.   Pulmonary/Chest: Effort normal and breath sounds normal.  Abdominal: Soft. Bowel sounds are normal. He exhibits no distension. There is no tenderness.  Musculoskeletal: Normal range of motion. He exhibits no edema.  Neurological: He is alert and oriented to person, place, and time. He has  normal strength. Coordination normal. GCS eye subscore is 4. GCS verbal subscore is 5. GCS motor subscore is 6.  Skin: Skin is warm, dry and intact.  Psychiatric: He has a normal mood and affect.    ED Course  Procedures (including critical care time) Labs Review Labs Reviewed  BASIC METABOLIC PANEL - Abnormal; Notable for the following:    Anion gap 17 (*)    All other components within normal limits  CBC  I-STAT TROPOININ, ED    Imaging Review Dg Chest 2 View  02/22/2014   CLINICAL DATA:  Chest pain and shortness of breath for 2 days  EXAM: CHEST  2 VIEW  COMPARISON:  08/20/2013  FINDINGS: Cardiac shadow is within normal limits. The lungs are clear bilaterally. No acute bony abnormality is noted.  IMPRESSION: No active cardiopulmonary disease.   Electronically Signed   By: Alcide CleverMark  Lukens M.D.   On: 02/22/2014 18:39     EKG Interpretation   Date/Time:  Tuesday February 22 2014 18:01:38 EDT Ventricular Rate:  73 PR Interval:  134 QRS Duration: 76 QT Interval:  392 QTC Calculation: 432 R Axis:   10 Text Interpretation:  Sinus rhythm NORMAL. NO CHANGE. Confirmed by  [name], MD, [name] [id number]) on 02/22/2014 6:15:16 PM      MDM   Final diagnoses:  Chest pain   At this point in time patient's pain is not highly suggestive of cardiac in etiology. There are no associated symptoms and no precipitation or exacerbation with exertion. The patient does have prior cardiac history, last stress test in 2014 did not show any acute abnormalities. At this point time with 3 days of chest pain and normal EKG and negative troponin, I will have the patient follow-up with his cardiologist this week for recheck. The patient will be counseled if he has any associated symptoms or changing in the quality of pattern of this pain he is to return to the emergency department.    Arby BarretteMarcy Bitha Fauteux, MD 02/22/14 2117

## 2014-02-22 NOTE — Discharge Instructions (Signed)

## 2014-02-22 NOTE — ED Notes (Addendum)
Pt reports onset of chest pain on Sunday. Pt says he took a nitro last night and today around 11:30 without relief of pain. Hx MI and stents

## 2016-11-19 ENCOUNTER — Encounter (HOSPITAL_COMMUNITY): Payer: Self-pay | Admitting: Emergency Medicine

## 2016-11-19 ENCOUNTER — Emergency Department (HOSPITAL_COMMUNITY)
Admission: EM | Admit: 2016-11-19 | Discharge: 2016-11-19 | Disposition: A | Discharge status: Home / Self Care | Payer: BC Managed Care – PPO | Attending: Emergency Medicine | Admitting: Emergency Medicine

## 2016-11-19 ENCOUNTER — Emergency Department (HOSPITAL_COMMUNITY): Payer: BC Managed Care – PPO

## 2016-11-19 DIAGNOSIS — I10 Essential (primary) hypertension: Secondary | ICD-10-CM | POA: Insufficient documentation

## 2016-11-19 DIAGNOSIS — R0602 Shortness of breath: Secondary | ICD-10-CM | POA: Diagnosis present

## 2016-11-19 DIAGNOSIS — Z87891 Personal history of nicotine dependence: Secondary | ICD-10-CM | POA: Diagnosis not present

## 2016-11-19 DIAGNOSIS — Z79899 Other long term (current) drug therapy: Secondary | ICD-10-CM | POA: Insufficient documentation

## 2016-11-19 DIAGNOSIS — I2510 Atherosclerotic heart disease of native coronary artery without angina pectoris: Secondary | ICD-10-CM | POA: Insufficient documentation

## 2016-11-19 LAB — CBC WITH DIFFERENTIAL/PLATELET
BASOS ABS: 0.1 10*3/uL (ref 0.0–0.1)
Basophils Relative: 1 %
Eosinophils Absolute: 0.1 10*3/uL (ref 0.0–0.7)
Eosinophils Relative: 1 %
HEMATOCRIT: 45.3 % (ref 39.0–52.0)
Hemoglobin: 15.9 g/dL (ref 13.0–17.0)
Lymphocytes Relative: 20 %
Lymphs Abs: 1.7 10*3/uL (ref 0.7–4.0)
MCH: 30.2 pg (ref 26.0–34.0)
MCHC: 35.1 g/dL (ref 30.0–36.0)
MCV: 86.1 fL (ref 78.0–100.0)
Monocytes Absolute: 0.5 10*3/uL (ref 0.1–1.0)
Monocytes Relative: 6 %
NEUTROS ABS: 6.3 10*3/uL (ref 1.7–7.7)
Neutrophils Relative %: 72 %
Platelets: 240 10*3/uL (ref 150–400)
RBC: 5.26 MIL/uL (ref 4.22–5.81)
RDW: 12.4 % (ref 11.5–15.5)
WBC: 8.6 10*3/uL (ref 4.0–10.5)

## 2016-11-19 LAB — I-STAT TROPONIN, ED: TROPONIN I, POC: 0 ng/mL (ref 0.00–0.08)

## 2016-11-19 LAB — BASIC METABOLIC PANEL
ANION GAP: 9 (ref 5–15)
BUN: 10 mg/dL (ref 6–20)
CHLORIDE: 103 mmol/L (ref 101–111)
CO2: 25 mmol/L (ref 22–32)
Calcium: 9.4 mg/dL (ref 8.9–10.3)
Creatinine, Ser: 1.03 mg/dL (ref 0.61–1.24)
GFR calc non Af Amer: >60 mL/min (ref >60)
Glucose, Bld: 147 mg/dL — ABNORMAL HIGH (ref 65–99)
Potassium: 3.8 mmol/L (ref 3.5–5.1)
SODIUM: 137 mmol/L (ref 135–145)

## 2016-11-19 LAB — CBG MONITORING, ED: GLUCOSE-CAPILLARY: 144 mg/dL — AB (ref 65–99)

## 2016-11-19 MED ORDER — HYDROCHLOROTHIAZIDE 12.5 MG PO CAPS: 12.5000 mg | ORAL_CAPSULE | Freq: Once | ORAL | Status: DC

## 2016-11-19 MED ORDER — HYDROCHLOROTHIAZIDE 25 MG PO TABS: 12.5000 mg | ORAL_TABLET | Freq: Every day | ORAL | 0 refills | Status: AC

## 2016-11-19 NOTE — ED Notes (Signed)
Pt to xray

## 2016-11-19 NOTE — Discharge Instructions (Addendum)
-   Take your normal dose of metoprolol this evening - Start taking one half tablet of HCTZ tomorrow morning. If your blood pressure remains elevated after 3-5 days, you can increase this to a full tablet. - It is VERY important to follow-up with Dr. Tresa EndoKelly in 1 week for repeat labs and check-up. All blood pressure medications can affect your kidney function and electrolytes, so you will need repeat labs.

## 2016-11-19 NOTE — ED Provider Notes (Signed)
[facility]-EMERGENCY DEPT Provider Note   CSN: [id number] Arrival date & time: 11/19/16  4098     History   Chief Complaint Chief Complaint  Patient presents with  . Hypertension    HPI Shane Shea is a 56 y.o. male.  HPI  56 yo m with PMHx as below here with worsening HTN. Pt has ho MI and is on metoprolol. Over the past 2-3 months he has noticed increasing BP, consistently now 150s systolic. Last night, pt felt "uneasy" and checked his BP and it was 170/90s. He went to sleep and at around 1 AM, developed mild shortness of breath, anxiety/sweating. He checked his BP and it was again elevated. He took two extra doses of his metoprolol without improvement initially, so he presents to the ED. He is now asymptomatic. Denies any ongoing HA, vision changes, CP, SOB, or other complaints. No abdominal pain, nausea, or vomiting. No other recent med changes. Sees a Cardiologist in [place], PCP is Dr. Tresa Endo.  Past Medical History:  Diagnosis Date  . Anxiety    no treated  . CAD (coronary artery disease)    a. 01/2010 MI and PCI - Dr. Chales Abrahams in [facility];  b. 04/2012 Negative Ex Cardiolite.  Marland Kitchen GERD (gastroesophageal reflux disease)   . Hyperlipidemia   . Hypertension   . Myocardial infarct [facility])     Patient Active Problem List   Diagnosis Date Noted  . Unstable angina (HCC) 05/19/2012  . CAD (coronary artery disease) 05/19/2012  . HTN (hypertension) 05/19/2012  . Hyperlipidemia 05/19/2012  . GERD (gastroesophageal reflux disease)   . Anxiety   . Intermediate coronary syndrome (HCC) 05/18/2012  . Recurrent left inguinal hernia 11/21/2011    Past Surgical History:  Procedure Laterality Date  . CORONARY ANGIOPLASTY WITH STENT PLACEMENT    . EXTERNAL EAR SURGERY  1968  . HERNIA REPAIR  08/18/2006   Bilateral Inguinal Hernias  . INGUINAL HERNIA REPAIR  12/17/2011   Procedure: HERNIA REPAIR INGUINAL ADULT;  Surgeon: Wilmon Arms. Corliss Skains, MD;  Location: [facility];  Service: General;  Laterality: Left;  left recurrent inguinal hernia repair   . VASECTOMY  1995       Home Medications    Prior to Admission medications   Medication Sig Start Date End Date Taking? Authorizing Provider  aspirin EC 81 MG tablet Take 81 mg by mouth daily.    [provider]  atorvastatin (LIPITOR) 40 MG tablet Take 40 mg by mouth every evening.    [provider]  calcium carbonate (TUMS - DOSED IN MG ELEMENTAL CALCIUM) 500 MG chewable tablet Chew 2 tablets by mouth daily as needed for indigestion or heartburn (indigestion).     [provider]  EFFIENT 10 MG TABS Take 10 mg by mouth daily.  10/30/11   [provider]  famotidine (PEPCID) 20 MG tablet Take 1 tablet (20 mg total) by mouth 2 (two) times daily. 02/22/14   Arby Barrette, MD  hydrochlorothiazide (HYDRODIURIL) 25 MG tablet Take 0.5 tablets (12.5 mg total) by mouth daily. Start taking one half tablet daily. If your blood pressures remain greater than 140/90, you can take a whole tablet once daily. Do not take more than this. 11/19/16 12/19/16  Shaune Pollack, MD  metoprolol tartrate (LOPRESSOR) 25 MG tablet Take 25 mg by mouth 2 (two) times daily.  10/08/11   [provider]  nitroGLYCERIN (NITROSTAT) 0.4 MG SL tablet Place 0.4 mg under the tongue every  5 (five) minutes as needed. For chest pain.    [provider]  OVER THE COUNTER MEDICATION Place 1 spray into right nostril daily as needed (dryness). Saline spray to prevent nose bleeds    [provider]    Family History Family History  Problem Relation Age of Onset  . CAD Mother        CABG  . CAD Father        CABG    Social History Social History  Substance Use Topics  . Smoking status: Former Smoker    Quit date: 05/20/1987  . Smokeless tobacco: Former NeurosurgeonUser    Quit date: 11/21/1987  . Alcohol use No     Allergies   Patient has no known allergies.   Review of  Systems Review of Systems  Constitutional: Positive for diaphoresis and fatigue.  Respiratory: Positive for shortness of breath.   All other systems reviewed and are negative.    Physical Exam Updated Vital Signs BP 126/87   Pulse (!) 56   Temp 97.7 F (36.5 C) (Oral)   Resp (!) 21   Ht 5\' 10"  (1.778 m)   Wt 90.7 kg (200 lb)   SpO2 96%   BMI 28.70 kg/m   Physical Exam  Constitutional: He is oriented to person, place, and time. He appears well-developed and well-nourished. No distress.  HENT:  Head: Normocephalic and atraumatic.  Eyes: Conjunctivae are normal.  Neck: Neck supple.  Cardiovascular: Normal rate, regular rhythm and normal heart sounds.  Exam reveals no friction rub.   No murmur heard. Pulmonary/Chest: Effort normal and breath sounds normal. No respiratory distress. He has no wheezes. He has no rales.  Abdominal: He exhibits no distension.  Musculoskeletal: He exhibits no edema.  Neurological: He is alert and oriented to person, place, and time. He exhibits normal muscle tone.  Skin: Skin is warm. Capillary refill takes less than 2 seconds.  Psychiatric: He has a normal mood and affect.  Appears mildly anxious  Nursing note and vitals reviewed.    ED Treatments / Results  Labs (all labs ordered are listed, but only abnormal results are displayed) Labs Reviewed  BASIC METABOLIC PANEL - Abnormal; Notable for the following:       Result Value   Glucose, Bld 147 (*)    All other components within normal limits  CBG MONITORING, ED - Abnormal; Notable for the following:    Glucose-Capillary 144 (*)    All other components within normal limits  CBC WITH DIFFERENTIAL/PLATELET  I-STAT TROPONIN, ED    EKG  EKG Interpretation  Date/Time:  Tuesday November 19 2016 06:34:28 EDT Ventricular Rate:  56 PR Interval:    QRS Duration: 82 QT Interval:  400 QTC Calculation: 386 R Axis:   26 Text Interpretation:  Sinus rhythm Low voltage, precordial leads No  significant change since last tracing Confirmed by Rochele RaringWard, Kristen [phone]) on 11/19/2016 6:37:31 AM Also confirmed by Ward, Baxter HireKristen [phone]), editor Misty StanleyScales-Price, Shannon [phone])  on 11/19/2016 6:59:22 AM Also confirmed by Shaune PollackIsaacs, Munachimso Rigdon [phone])  on 11/19/2016 7:53:42 AM       Radiology No results found.  Procedures Procedures (including critical care time)  Medications Ordered in ED Medications - No data to display   Initial Impression / Assessment and Plan / ED Course  I have reviewed the triage vital signs and the nursing notes.  Pertinent labs & imaging results that were available during my care of the patient were reviewed by me and considered  in my medical decision making (see chart for details).     56 yo M with PMHx HTN, anxiety here with elevated BP and transient SOB/anxiety. Unclear if this is primary anxiety versus worsening, now symptomatic HTN. No hA, vision changes, or s/s to suggest hypertensive encephalopathy or bleed. EKG non-ischemic, trop neg, no signs of ischemic injury. Lab work o/w reassuring, with normal renal function. I suspect pt may benefit from 2nd line HTN agent given his h/o CAD, HLD. CXR is without cardiomegaly, edema or PNA. His RR was recorded in 20s but this is 2/2 monitor error; on my exam, pt with RR 16-18, in NAD. Will discuss with on-call hospitalist in this pt with known CAD, on beta blocker.  Based on discussion with Hospitalist Dr. Konrad Dolores, will start on HCTZ 12.5 daily. His BP currently is improved because of his extra dose of metop. Will have him continue his normal metop dose and start HCTZ in the AM, with increase to 25 if BP remains over goal in 3-5 days. Will have him f/u with PCP in 1 week for repeat BP check, further management. Pt in agreement with this plan. Advised good return precautions.   Final Clinical Impressions(s) / ED Diagnoses   Final diagnoses:  Essential hypertension    New Prescriptions New Prescriptions    HYDROCHLOROTHIAZIDE (HYDRODIURIL) 25 MG TABLET    Take 0.5 tablets (12.5 mg total) by mouth daily. Start taking one half tablet daily. If your blood pressures remain greater than 140/90, you can take a whole tablet once daily. Do not take more than this.     Shaune Pollack, MD 11/19/16 [phone]

## 2016-11-19 NOTE — ED Notes (Signed)
Pt states he took Lopressor 50 mg Pta.

## 2016-11-19 NOTE — ED Triage Notes (Signed)
Pt concern about high blood pressure when he woke up 180/100 and cold sweet, pt denies any cp, nausea or sOb at this time.

## 2017-02-12 ENCOUNTER — Emergency Department (HOSPITAL_COMMUNITY)
Admission: EM | Admit: 2017-02-12 | Discharge: 2017-02-12 | Disposition: A | Discharge status: Home / Self Care | Payer: BC Managed Care – PPO | Attending: Physician Assistant | Admitting: Physician Assistant

## 2017-02-12 ENCOUNTER — Emergency Department (HOSPITAL_COMMUNITY): Payer: BC Managed Care – PPO

## 2017-02-12 ENCOUNTER — Encounter (HOSPITAL_COMMUNITY): Payer: Self-pay | Admitting: Emergency Medicine

## 2017-02-12 DIAGNOSIS — Z7982 Long term (current) use of aspirin: Secondary | ICD-10-CM | POA: Insufficient documentation

## 2017-02-12 DIAGNOSIS — Z87891 Personal history of nicotine dependence: Secondary | ICD-10-CM | POA: Diagnosis not present

## 2017-02-12 DIAGNOSIS — I119 Hypertensive heart disease without heart failure: Secondary | ICD-10-CM | POA: Insufficient documentation

## 2017-02-12 DIAGNOSIS — Z79899 Other long term (current) drug therapy: Secondary | ICD-10-CM | POA: Diagnosis not present

## 2017-02-12 DIAGNOSIS — R079 Chest pain, unspecified: Secondary | ICD-10-CM | POA: Diagnosis present

## 2017-02-12 LAB — CBC
HCT: 46 % (ref 39.0–52.0)
HEMOGLOBIN: 15.5 g/dL (ref 13.0–17.0)
MCH: 29.8 pg (ref 26.0–34.0)
MCHC: 33.7 g/dL (ref 30.0–36.0)
MCV: 88.3 fL (ref 78.0–100.0)
Platelets: 220 10*3/uL (ref 150–400)
RBC: 5.21 MIL/uL (ref 4.22–5.81)
RDW: 12.7 % (ref 11.5–15.5)
WBC: 5.8 10*3/uL (ref 4.0–10.5)

## 2017-02-12 LAB — BASIC METABOLIC PANEL
ANION GAP: 10 (ref 5–15)
BUN: 11 mg/dL (ref 6–20)
CO2: 26 mmol/L (ref 22–32)
CREATININE: 1.05 mg/dL (ref 0.61–1.24)
Calcium: 9 mg/dL (ref 8.9–10.3)
Chloride: 100 mmol/L — ABNORMAL LOW (ref 101–111)
GFR calc Af Amer: >60 mL/min (ref >60)
GFR calc non Af Amer: >60 mL/min (ref >60)
Glucose, Bld: 135 mg/dL — ABNORMAL HIGH (ref 65–99)
POTASSIUM: 4 mmol/L (ref 3.5–5.1)
SODIUM: 136 mmol/L (ref 135–145)

## 2017-02-12 LAB — I-STAT TROPONIN, ED
Troponin i, poc: 0 ng/mL (ref 0.00–0.08)
Troponin i, poc: 0 ng/mL (ref 0.00–0.08)

## 2017-02-12 NOTE — ED Notes (Signed)
Patient transported to X-ray 

## 2017-02-12 NOTE — ED Triage Notes (Signed)
Patient coming from home complaining of central chest pain since last night that radiates into left arm. Patient reports dizziness, SOB, and nausea. Patient alert and oriented x 4.

## 2017-02-12 NOTE — Discharge Instructions (Signed)
We will use return to follow-up with your cardiologist as soon as possible. If you have any returning chest pain, please return immediatley to the emergency department.

## 2017-02-12 NOTE — ED Provider Notes (Signed)
[facility] EMERGENCY DEPARTMENT Provider Note   CSN: [id number] Arrival date & time: 02/12/17  4098     History   Chief Complaint Chief Complaint  Patient presents with  . Chest Pain    HPI Shane Shea is a 56 y.o. male.  HPI   56 yo with ho CAD, PCI at [place] in [place], follows with cards at [facility].  Seen in 2014 and 2013 for chest pain , admitted and felt to be indigestion/atypical CP.  At that time in 2014 2013 Dr.Crenshaw. At that time they did not feel that he needed a stress test. Patient presenting with atypical chest pain again today. Patient was driving on his way to work and started to feel "a feeling of low blood pressure and adrenaline shooting through his left arm.". It is since resolved. It lasted for minutes. Nonexertional. Patient reports he started to panic and he had diaphoresis with it. No shortness of breath.   Past Medical History:  Diagnosis Date  . Anxiety    no treated  . CAD (coronary artery disease)    a. 01/2010 MI and PCI - Dr. Chales Abrahams in [facility];  b. 04/2012 Negative Ex Cardiolite.  Marland Kitchen GERD (gastroesophageal reflux disease)   . Hyperlipidemia   . Hypertension   . Myocardial infarct [facility])     Patient Active Problem List   Diagnosis Date Noted  . Unstable angina (HCC) 05/19/2012  . CAD (coronary artery disease) 05/19/2012  . HTN (hypertension) 05/19/2012  . Hyperlipidemia 05/19/2012  . GERD (gastroesophageal reflux disease)   . Anxiety   . Intermediate coronary syndrome (HCC) 05/18/2012  . Recurrent left inguinal hernia 11/21/2011    Past Surgical History:  Procedure Laterality Date  . CORONARY ANGIOPLASTY WITH STENT PLACEMENT    . EXTERNAL EAR SURGERY  1968  . HERNIA REPAIR  08/18/2006   Bilateral Inguinal Hernias  . INGUINAL HERNIA REPAIR  12/17/2011   Procedure: HERNIA REPAIR INGUINAL ADULT;  Surgeon: Wilmon Arms. Corliss Skains, MD;  Location: [facility];  Service: General;  Laterality: Left;  left  recurrent inguinal hernia repair   . VASECTOMY  1995       Home Medications    Prior to Admission medications   Medication Sig Start Date End Date Taking? Authorizing Provider  aspirin 325 MG tablet Take 325 mg by mouth daily.    [provider]  atorvastatin (LIPITOR) 40 MG tablet Take 40 mg by mouth every evening.    [provider]  calcium carbonate (TUMS - DOSED IN MG ELEMENTAL CALCIUM) 500 MG chewable tablet Chew 2 tablets by mouth daily as needed for indigestion or heartburn (indigestion).     [provider]  famotidine (PEPCID) 20 MG tablet Take 1 tablet (20 mg total) by mouth 2 (two) times daily. 02/22/14   Arby Barrette, MD  hydrochlorothiazide (HYDRODIURIL) 25 MG tablet Take 0.5 tablets (12.5 mg total) by mouth daily. Start taking one half tablet daily. If your blood pressures remain greater than 140/90, you can take a whole tablet once daily. Do not take more than this. 11/19/16 12/19/16  Shaune Pollack, MD  metoprolol tartrate (LOPRESSOR) 25 MG tablet Take 25 mg by mouth 2 (two) times daily.  10/08/11   [provider]  nitroGLYCERIN (NITROSTAT) 0.4 MG SL tablet Place 0.4 mg under the tongue every 5 (five) minutes as needed. For chest pain.    [provider]  OVER THE COUNTER MEDICATION Place 1 spray into  right nostril daily as needed (dryness). Saline spray to prevent nose bleeds    [provider]  PARoxetine (PAXIL) 10 MG tablet Take 10 mg by mouth daily. 06/04/16   [provider]    Family History Family History  Problem Relation Age of Onset  . CAD Mother        CABG  . CAD Father        CABG    Social History Social History  Substance Use Topics  . Smoking status: Former Smoker    Quit date: 05/20/1987  . Smokeless tobacco: Former Neurosurgeon    Quit date: 11/21/1987  . Alcohol use No     Allergies   Patient has no known allergies.   Review of Systems Review of Systems  Constitutional: Negative  for activity change.  Respiratory: Negative for shortness of breath.   Cardiovascular: Positive for chest pain.  Gastrointestinal: Negative for abdominal pain.     Physical Exam Updated Vital Signs BP (!) 164/87 (BP Location: Right Arm)   Pulse 63   Temp 97.6 F (36.4 C) (Oral)   Resp 14   Ht 5\' 10"  (1.778 m)   Wt 86.2 kg (190 lb)   SpO2 100%   BMI 27.26 kg/m   Physical Exam  Constitutional: He is oriented to person, place, and time. He appears well-nourished.  HENT:  Head: Normocephalic.  Eyes: Conjunctivae are normal.  Cardiovascular: Normal rate and regular rhythm.   No murmur heard. Pulmonary/Chest: Effort normal and breath sounds normal.  Abdominal: Soft. Bowel sounds are normal. He exhibits no distension. There is no tenderness.  Neurological: He is oriented to person, place, and time.  Skin: Skin is warm and dry. He is not diaphoretic.  Psychiatric: He has a normal mood and affect. His behavior is normal.     ED Treatments / Results  Labs (all labs ordered are listed, but only abnormal results are displayed) Labs Reviewed  BASIC METABOLIC PANEL  CBC  I-STAT TROPONIN, ED    EKG  EKG Interpretation  Date/Time:  Wednesday February 12 2017 07:29:52 EDT Ventricular Rate:  60 PR Interval:    QRS Duration: 84 QT Interval:  405 QTC Calculation: 405 R Axis:   6 Text Interpretation:  Sinus rhythm No significant change since last tracing Confirmed by Corlis Leak, Issa Kosmicki ([id number]) on 02/12/2017 8:51:26 AM       Radiology No results found.  Procedures Procedures (including critical care time)  Medications Ordered in ED Medications - No data to display   Initial Impression / Assessment and Plan / ED Course  I have reviewed the triage vital signs and the nursing notes.  Pertinent labs & imaging results that were available during my care of the patient were reviewed by me and considered in my medical decision making (see chart for details).    56 yo with  ho CAD, [place] at [place] in [place], follows with cards at [facility].  Seen in 2014 and 2013 for chest pain , admitted and felt to be indigestion/atypical CP.  At that time in 2014 2013 Dr.Crenshaw. At that time they did not feel that he needed a stress test. Patient presenting with atypical chest pain again today. Patient was driving on his way to work and started to feel "a feeling of low blood pressure and adrenaline shooting through his left arm.". It is since resolved. It lasted for minutes. Nonexertional. Patient reports he started to panic and he had diaphoresis with it. No shortness of breath.  Chest pain sounds very atypical. Had a long discussion with patient about the use of the troponin. We offered admission to get serial troponins given his high heart score. However patient preferred to do delta troponins, knowing that there is some risk that we will not capture all ischemic heart disease this way. Patient has been seen by his cardiologist in the last year. We'll have him go back to his cardiologist for further risk stratificaiton.. I think this is a reasonable decision given that this is very atypical for cardiac chest pain.   Final Clinical Impressions(s) / ED Diagnoses   Final diagnoses:  None    New Prescriptions New Prescriptions   No medications on file     Abelino DerrickMackuen, Naelle Diegel Lyn, MD 02/17/17 [phone]

## 2017-04-06 ENCOUNTER — Emergency Department (HOSPITAL_COMMUNITY): Payer: BC Managed Care – PPO

## 2017-04-06 ENCOUNTER — Emergency Department (HOSPITAL_COMMUNITY)
Admission: EM | Admit: 2017-04-06 | Discharge: 2017-04-07 | Disposition: A | Discharge status: Home / Self Care | Payer: BC Managed Care – PPO | Attending: Emergency Medicine | Admitting: Emergency Medicine

## 2017-04-06 DIAGNOSIS — Z87891 Personal history of nicotine dependence: Secondary | ICD-10-CM | POA: Diagnosis not present

## 2017-04-06 DIAGNOSIS — I10 Essential (primary) hypertension: Secondary | ICD-10-CM | POA: Insufficient documentation

## 2017-04-06 DIAGNOSIS — Z7982 Long term (current) use of aspirin: Secondary | ICD-10-CM | POA: Insufficient documentation

## 2017-04-06 DIAGNOSIS — I2510 Atherosclerotic heart disease of native coronary artery without angina pectoris: Secondary | ICD-10-CM | POA: Diagnosis not present

## 2017-04-06 DIAGNOSIS — Z79899 Other long term (current) drug therapy: Secondary | ICD-10-CM | POA: Diagnosis not present

## 2017-04-06 DIAGNOSIS — R0602 Shortness of breath: Secondary | ICD-10-CM | POA: Diagnosis not present

## 2017-04-06 LAB — CBC
HEMATOCRIT: 44.6 % (ref 39.0–52.0)
Hemoglobin: 15.7 g/dL (ref 13.0–17.0)
MCH: 30.5 pg (ref 26.0–34.0)
MCHC: 35.2 g/dL (ref 30.0–36.0)
MCV: 86.6 fL (ref 78.0–100.0)
PLATELETS: 252 10*3/uL (ref 150–400)
RBC: 5.15 MIL/uL (ref 4.22–5.81)
RDW: 12.5 % (ref 11.5–15.5)
WBC: 17.6 10*3/uL — ABNORMAL HIGH (ref 4.0–10.5)

## 2017-04-06 LAB — I-STAT CHEM 8, ED
BUN: 15 mg/dL (ref 6–20)
CALCIUM ION: 1.1 mmol/L — AB (ref 1.15–1.40)
CHLORIDE: 100 mmol/L — AB (ref 101–111)
CREATININE: 0.9 mg/dL (ref 0.61–1.24)
Glucose, Bld: 135 mg/dL — ABNORMAL HIGH (ref 65–99)
HCT: 42 % (ref 39.0–52.0)
Hemoglobin: 14.3 g/dL (ref 13.0–17.0)
Potassium: 3.7 mmol/L (ref 3.5–5.1)
SODIUM: 140 mmol/L (ref 135–145)
TCO2: 25 mmol/L (ref 22–32)

## 2017-04-06 LAB — BASIC METABOLIC PANEL
Anion gap: 16 — ABNORMAL HIGH (ref 5–15)
BUN: 14 mg/dL (ref 6–20)
CHLORIDE: 100 mmol/L — AB (ref 101–111)
CO2: 19 mmol/L — AB (ref 22–32)
CREATININE: 1.13 mg/dL (ref 0.61–1.24)
Calcium: 9.1 mg/dL (ref 8.9–10.3)
GFR calc non Af Amer: >60 mL/min (ref >60)
GLUCOSE: 142 mg/dL — AB (ref 65–99)
Potassium: 2.9 mmol/L — ABNORMAL LOW (ref 3.5–5.1)
Sodium: 135 mmol/L (ref 135–145)

## 2017-04-06 LAB — I-STAT TROPONIN, ED: Troponin i, poc: 0 ng/mL (ref 0.00–0.08)

## 2017-04-06 MED ORDER — POTASSIUM CHLORIDE CRYS ER 20 MEQ PO TBCR
40.0000 meq | EXTENDED_RELEASE_TABLET | Freq: Once | ORAL | Status: AC
  Administered 2017-04-06: 40 meq via ORAL
  Filled 2017-04-06: qty 2

## 2017-04-06 MED ORDER — ALBUTEROL SULFATE (2.5 MG/3ML) 0.083% IN NEBU: 5.0000 mg | INHALATION_SOLUTION | Freq: Once | RESPIRATORY_TRACT | Status: DC

## 2017-04-06 NOTE — ED Provider Notes (Signed)
[facility] EMERGENCY DEPARTMENT Provider Note   CSN: [id number] Arrival date & time: 04/06/17  2200     History   Chief Complaint Chief Complaint  Patient presents with  . Shortness of Breath  . Anxiety    HPI Shane Shea is a 56 y.o. male with a history of MI, anxiety, who presents today for evaluation of shortness of breath.  He reports that he was shoveling the driveway when he became short of breath.  He reports that he became anxious when he began to feel short of breath.  He reports that he has a history of anxiety attacks and that this feels similar.  He did have one episode which EMS reported resembled an anxiety attack in route and they did not note any EKG changes.  He did have one episode of vomiting in route.  At this time he denies any nausea.  He denies any chest pain, headache, or shortness of breath, says that he is feeling better however still feels anxious.    He denies any history of prior PE or DVT.  No recent surgery or trauma within the past 4 weeks.  No recent hemoptysis or hormone use.  Here he says that he has not been sick recently.  He does not have any history of asthma.  Does not smoke. HPI  Past Medical History:  Diagnosis Date  . Anxiety    no treated  . CAD (coronary artery disease)    a. 01/2010 MI and PCI - Dr. Chales Abrahams in [facility];  b. 04/2012 Negative Ex Cardiolite.  Marland Kitchen GERD (gastroesophageal reflux disease)   . Hyperlipidemia   . Hypertension   . Myocardial infarct [facility])     Patient Active Problem List   Diagnosis Date Noted  . Unstable angina (HCC) 05/19/2012  . CAD (coronary artery disease) 05/19/2012  . HTN (hypertension) 05/19/2012  . Hyperlipidemia 05/19/2012  . GERD (gastroesophageal reflux disease)   . Anxiety   . Intermediate coronary syndrome (HCC) 05/18/2012  . Recurrent left inguinal hernia 11/21/2011    Past Surgical History:  Procedure Laterality Date  . CORONARY ANGIOPLASTY WITH STENT  PLACEMENT    . EXTERNAL EAR SURGERY  1968  . HERNIA REPAIR  08/18/2006   Bilateral Inguinal Hernias  . INGUINAL HERNIA REPAIR  12/17/2011   Procedure: HERNIA REPAIR INGUINAL ADULT;  Surgeon: Wilmon Arms. Corliss Skains, MD;  Location: [facility];  Service: General;  Laterality: Left;  left recurrent inguinal hernia repair   . VASECTOMY  1995       Home Medications    Prior to Admission medications   Medication Sig Start Date End Date Taking? Authorizing Provider  aspirin 325 MG tablet Take 325 mg by mouth daily.   Yes [provider]  atorvastatin (LIPITOR) 40 MG tablet Take 40 mg by mouth every evening.   Yes [provider]  calcium carbonate (TUMS - DOSED IN MG ELEMENTAL CALCIUM) 500 MG chewable tablet Chew 2 tablets by mouth daily as needed for indigestion or heartburn (indigestion).    Yes [provider]  famotidine (PEPCID) 20 MG tablet Take 1 tablet (20 mg total) by mouth 2 (two) times daily. Patient taking differently: Take 20 mg by mouth at bedtime.  02/22/14  Yes Arby Barrette, MD  hydrochlorothiazide (HYDRODIURIL) 25 MG tablet Take 0.5 tablets (12.5 mg total) by mouth daily. Start taking one half tablet daily. If your blood pressures remain greater than 140/90, you can take a  whole tablet once daily. Do not take more than this. 11/19/16 04/06/17 Yes Shaune PollackIsaacs, Cameron, MD  metoprolol tartrate (LOPRESSOR) 25 MG tablet Take 25 mg by mouth at bedtime.  10/08/11  Yes [provider]  nitroGLYCERIN (NITROSTAT) 0.4 MG SL tablet Place 0.4 mg under the tongue every 5 (five) minutes as needed. For chest pain.   Yes [provider]  OVER THE COUNTER MEDICATION Place 1 spray into right nostril daily as needed (dryness). Saline spray to prevent nose bleeds   Yes [provider]  PARoxetine (PAXIL) 10 MG tablet Take 10 mg by mouth daily. 06/04/16  Yes [provider]    Family History Family History  Problem Relation Age of  Onset  . CAD Mother        CABG  . CAD Father        CABG    Social History Social History   Tobacco Use  . Smoking status: Former Smoker    Last attempt to quit: 05/20/1987    Years since quitting: 29.9  . Smokeless tobacco: Former NeurosurgeonUser    Quit date: 11/21/1987  Substance Use Topics  . Alcohol use: No  . Drug use: No     Allergies   Patient has no known allergies.   Review of Systems Review of Systems  Constitutional: Negative for chills and fever.  HENT: Negative for ear pain and sore throat.   Eyes: Negative for pain and visual disturbance.  Respiratory: Positive for shortness of breath. Negative for cough and chest tightness.   Cardiovascular: Negative for chest pain and palpitations.  Gastrointestinal: Positive for nausea and vomiting. Negative for abdominal pain.  Genitourinary: Negative for dysuria and hematuria.  Musculoskeletal: Negative for arthralgias and back pain.  Skin: Negative for color change and rash.  Neurological: Negative for seizures and syncope.  Psychiatric/Behavioral: The patient is nervous/anxious.   All other systems reviewed and are negative.    Physical Exam Updated Vital Signs BP 114/77   Pulse 72   Temp 98.2 F (36.8 C) (Oral)   Resp 17   Ht 5\' 10"  (1.778 m)   Wt 81.6 kg (180 lb)   SpO2 98%   BMI 25.83 kg/m   Physical Exam  Constitutional: He is oriented to person, place, and time. He appears well-developed and well-nourished.  HENT:  Head: Normocephalic and atraumatic.  Mouth/Throat: Oropharynx is clear and moist.  Eyes: Conjunctivae are normal.  Neck: Neck supple.  Cardiovascular: Normal rate and regular rhythm.  No murmur heard. Pulmonary/Chest: Effort normal and breath sounds normal. No respiratory distress. He has no decreased breath sounds.  Abdominal: Soft. Normal appearance. There is no tenderness.  Musculoskeletal: He exhibits no edema.       Right lower leg: Normal. He exhibits no tenderness and no edema.        Left lower leg: Normal. He exhibits no tenderness and no edema.  Neurological: He is alert and oriented to person, place, and time.  Skin: Skin is warm and dry. He is not diaphoretic.  Psychiatric: His speech is normal and behavior is normal. His mood appears anxious.  Patient appears anxious, is repeatedly picking at his thumb nail using his second finger. He is attentive.  Nursing note and vitals reviewed.    ED Treatments / Results  Labs (all labs ordered are listed, but only abnormal results are displayed) Labs Reviewed  BASIC METABOLIC PANEL - Abnormal; Notable for the following components:      Result Value  Potassium 2.9 (*)    Chloride 100 (*)    CO2 19 (*)    Glucose, Bld 142 (*)    Anion gap 16 (*)    All other components within normal limits  CBC - Abnormal; Notable for the following components:   WBC 17.6 (*)    All other components within normal limits  I-STAT CHEM 8, ED - Abnormal; Notable for the following components:   Chloride 100 (*)    Glucose, Bld 135 (*)    Calcium, Ion 1.10 (*)    All other components within normal limits  I-STAT TROPONIN, ED    EKG  EKG Interpretation  Date/Time:  Sunday April 06 2017 22:06:10 EST Ventricular Rate:  89 PR Interval:    QRS Duration: 84 QT Interval:  363 QTC Calculation: 442 R Axis:   -20 Text Interpretation:  Sinus rhythm Borderline left axis deviation No significant change since last tracing Confirmed by Melene PlanFloyd, Dan [phone]([id number]) on 04/06/2017 10:12:51 PM       Radiology Dg Chest Port 1 View  Result Date: 04/06/2017 CLINICAL DATA:  Acute onset of shortness of breath. EXAM: PORTABLE CHEST 1 VIEW COMPARISON:  Chest radiograph performed 02/12/2017 FINDINGS: The lungs are well-aerated and clear. There is no evidence of focal opacification, pleural effusion or pneumothorax. The cardiomediastinal silhouette is within normal limits. No acute osseous abnormalities are seen. IMPRESSION: No acute cardiopulmonary process  seen. Electronically Signed   By: Roanna RaiderJeffery  Chang M.D.   On: 04/06/2017 23:30    Procedures Procedures (including critical care time)  Medications Ordered in ED Medications  potassium chloride SA (K-DUR,KLOR-CON) CR tablet 40 mEq (40 mEq Oral Given 04/06/17 2341)     Initial Impression / Assessment and Plan / ED Course  I have reviewed the triage vital signs and the nursing notes.  Pertinent labs & imaging results that were available during my care of the patient were reviewed by me and considered in my medical decision making (see chart for details).    Houston SirenFrederick P Hafer presents today for evaluation of shortness of breath that occurred while shoveling snow.  He denied any chest pain, no diaphoresis.  He had one episode of vomiting.  His shortness of breath improved with out any intervention.  Lungs CTAB.  He appears very anxious on exam and endorsed feeling anxious.  He has a history of anxiety attacks and says this feels similar.  CXR with out acute abnormalities.  Labs obtained and appear consistent with baseline.  Patient was observed on tele for multiple hours.  Initial troponin normal.  Wells Criteria for DVT 0 points , Wells criteria for PE 1.5  points  based on tachycardia which has resolved with out interventions, putting patient in low risk/unlikely.  At shift change care was transferred to Dr. Adela LankFloyd who will follow pending studies, re-evaulate and determine disposition.     Final Clinical Impressions(s) / ED Diagnoses   Final diagnoses:  None    ED Discharge Orders    None       Cristina GongHammond, Amaira Safley W, PA-C 04/07/17 0032    Melene PlanFloyd, Dan, DO 04/07/17 0301

## 2017-04-06 NOTE — ED Triage Notes (Signed)
Pt from home via EMS for SOB x 3 hours. Per EMS, pt was shoveling the driveway when he became SOB. Pt with hx of anxiety and reports he became anxious when his SOB began. Pt seen previously for similar. Denies CP, dizziness, pain, diaphoresis. EMS reports pt had an episode resembling an anxiety attack en route with no EKG changes. EMS VS: 116/70, 92 HR, 99% on RA. Pt ambulatory to stretcher. A&Ox4.

## 2017-04-07 DIAGNOSIS — R0602 Shortness of breath: Secondary | ICD-10-CM | POA: Diagnosis not present

## 2017-04-07 LAB — I-STAT TROPONIN, ED: Troponin i, poc: 0 ng/mL (ref 0.00–0.08)

## 2017-04-07 LAB — I-STAT CG4 LACTIC ACID, ED: LACTIC ACID, VENOUS: 1.06 mmol/L (ref 0.5–1.9)

## 2017-04-07 MED ORDER — SODIUM CHLORIDE 0.9 % IV BOLUS (SEPSIS): 1000.0000 mL | Freq: Once | INTRAVENOUS | Status: DC

## 2017-04-07 MED ORDER — MAGNESIUM OXIDE 400 (241.3 MG) MG PO TABS
800.0000 mg | ORAL_TABLET | Freq: Once | ORAL | Status: AC
  Administered 2017-04-07: 800 mg via ORAL
  Filled 2017-04-07: qty 2

## 2019-04-17 ENCOUNTER — Ambulatory Visit: Payer: Self-pay

## 2019-04-17 NOTE — Telephone Encounter (Signed)
Pt c/o today with chills and previously had chest pain but stated it is gone now. Pt had 3 loose stools today. Pt denied any chest pain that radiated down arm, jaw, neck or through to the back. Advised pt that if chest pain reoccurs to call 911. Care advice given and advised pt on where to get testing and how to sign up for testing. Pt verbalized understanding.  Reason for Disposition . [1] COVID-19 infection suspected by caller or triager AND [2] mild symptoms (cough, fever, or others) AND [4] no complications or SOB  Answer Assessment - Initial Assessment Questions 1. COVID-19 DIAGNOSIS: "Who made your Coronavirus (COVID-19) diagnosis?" "Was it confirmed by a positive lab test?" If not diagnosed by a HCP, ask "Are there lots of cases (community spread) where you live?" (See public health department website, if unsure)     N/a-no-n/a 2. COVID-19 EXPOSURE: "Was there any known exposure to COVID before the symptoms began?" CDC Definition of close contact: within 6 feet (2 meters) for a total of 15 minutes or more over a 24-hour period.      no 3. ONSET: "When did the COVID-19 symptoms start?"      today 4. WORST SYMPTOM: "What is your worst symptom?" (e.g., cough, fever, shortness of breath, muscle aches)     Chest pain (now subsided)- chills 5. COUGH: "Do you have a cough?" If so, ask: "How bad is the cough?"       no 6. FEVER: "Do you have a fever?" If so, ask: "What is your temperature, how was it measured, and when did it start?"     No but having chills 7. RESPIRATORY STATUS: "Describe your breathing?" (e.g., shortness of breath, wheezing, unable to speak)      no 8. BETTER-SAME-WORSE: "Are you getting better, staying the same or getting worse compared to yesterday?"  If getting worse, ask, "In what way?"     worse 9. HIGH RISK DISEASE: "Do you have any chronic medical problems?" (e.g., asthma, heart or lung disease, weak immune system, obesity, etc.)     Heart attack 01/2010 10.  PREGNANCY: "Is there any chance you are pregnant?" "When was your last menstrual period?"       n/a 11. OTHER SYMPTOMS: "Do you have any other symptoms?"  (e.g., chills, fatigue, headache, loss of smell or taste, muscle pain, sore throat; new loss of smell or taste especially support the diagnosis of COVID-19)       Chills, diarrhea x 3 today  Protocols used: CORONAVIRUS (COVID-19) DIAGNOSED OR SUSPECTED-A-AH

## 2019-04-19 ENCOUNTER — Ambulatory Visit: Payer: BC Managed Care – PPO | Attending: Internal Medicine

## 2019-04-19 DIAGNOSIS — Z20822 Contact with and (suspected) exposure to covid-19: Secondary | ICD-10-CM

## 2019-04-20 LAB — NOVEL CORONAVIRUS, NAA: SARS-CoV-2, NAA: NOT DETECTED

## 2019-08-25 ENCOUNTER — Emergency Department (HOSPITAL_COMMUNITY)
Admission: EM | Admit: 2019-08-25 | Discharge: 2019-08-25 | Disposition: A | Discharge status: Home / Self Care | Payer: BC Managed Care – PPO | Attending: Emergency Medicine | Admitting: Emergency Medicine

## 2019-08-25 ENCOUNTER — Encounter (HOSPITAL_COMMUNITY): Payer: Self-pay | Admitting: Emergency Medicine

## 2019-08-25 ENCOUNTER — Other Ambulatory Visit: Payer: Self-pay

## 2019-08-25 ENCOUNTER — Emergency Department (HOSPITAL_COMMUNITY): Payer: BC Managed Care – PPO

## 2019-08-25 DIAGNOSIS — R079 Chest pain, unspecified: Secondary | ICD-10-CM | POA: Insufficient documentation

## 2019-08-25 DIAGNOSIS — Z5321 Procedure and treatment not carried out due to patient leaving prior to being seen by health care provider: Secondary | ICD-10-CM | POA: Insufficient documentation

## 2019-08-25 LAB — BASIC METABOLIC PANEL
Anion gap: 13 (ref 5–15)
BUN: 14 mg/dL (ref 6–20)
CO2: 30 mmol/L (ref 22–32)
Calcium: 9.6 mg/dL (ref 8.9–10.3)
Chloride: 96 mmol/L — ABNORMAL LOW (ref 98–111)
Creatinine, Ser: 0.99 mg/dL (ref 0.61–1.24)
GFR calc Af Amer: >60 mL/min (ref >60)
GFR calc non Af Amer: >60 mL/min (ref >60)
Glucose, Bld: 116 mg/dL — ABNORMAL HIGH (ref 70–99)
Potassium: 3.1 mmol/L — ABNORMAL LOW (ref 3.5–5.1)
Sodium: 139 mmol/L (ref 135–145)

## 2019-08-25 LAB — CBC
HCT: 48.6 % (ref 39.0–52.0)
Hemoglobin: 16.5 g/dL (ref 13.0–17.0)
MCH: 29.5 pg (ref 26.0–34.0)
MCHC: 34 g/dL (ref 30.0–36.0)
MCV: 86.8 fL (ref 80.0–100.0)
Platelets: 254 10*3/uL (ref 150–400)
RBC: 5.6 MIL/uL (ref 4.22–5.81)
RDW: 12 % (ref 11.5–15.5)
WBC: 7.5 10*3/uL (ref 4.0–10.5)
nRBC: 0 % (ref 0.0–0.2)

## 2019-08-25 LAB — TROPONIN I (HIGH SENSITIVITY): Troponin I (High Sensitivity): 11 ng/L (ref <18)

## 2019-08-25 MED ORDER — SODIUM CHLORIDE 0.9% FLUSH: 3.0000 mL | Freq: Once | INTRAVENOUS | Status: DC

## 2019-08-25 NOTE — ED Notes (Signed)
No answer x2 for vitals recheck 

## 2019-08-25 NOTE — ED Notes (Signed)
Pt did not respond to vital recheck  

## 2019-08-25 NOTE — ED Triage Notes (Signed)
Pt arrives to ED from home with complaints of palpitations since Sunday and sharp centralized chest pain starting yesterday. Patient states he felt the same pain during his past heart attack. Patient denies SOB, N/V, or any other pai at this time.

## 2020-10-05 ENCOUNTER — Other Ambulatory Visit: Payer: Self-pay

## 2020-10-05 ENCOUNTER — Emergency Department (HOSPITAL_COMMUNITY)
Admission: EM | Admit: 2020-10-05 | Discharge: 2020-10-05 | Disposition: A | Discharge status: Home / Self Care | Payer: BC Managed Care – PPO | Attending: Emergency Medicine | Admitting: Emergency Medicine

## 2020-10-05 ENCOUNTER — Encounter (HOSPITAL_COMMUNITY): Payer: Self-pay | Admitting: Emergency Medicine

## 2020-10-05 ENCOUNTER — Emergency Department (HOSPITAL_COMMUNITY): Payer: BC Managed Care – PPO

## 2020-10-05 DIAGNOSIS — Z955 Presence of coronary angioplasty implant and graft: Secondary | ICD-10-CM | POA: Diagnosis not present

## 2020-10-05 DIAGNOSIS — Z7982 Long term (current) use of aspirin: Secondary | ICD-10-CM | POA: Insufficient documentation

## 2020-10-05 DIAGNOSIS — Z79899 Other long term (current) drug therapy: Secondary | ICD-10-CM | POA: Diagnosis not present

## 2020-10-05 DIAGNOSIS — R079 Chest pain, unspecified: Secondary | ICD-10-CM

## 2020-10-05 DIAGNOSIS — I2510 Atherosclerotic heart disease of native coronary artery without angina pectoris: Secondary | ICD-10-CM | POA: Diagnosis not present

## 2020-10-05 DIAGNOSIS — I10 Essential (primary) hypertension: Secondary | ICD-10-CM | POA: Diagnosis not present

## 2020-10-05 DIAGNOSIS — Z87891 Personal history of nicotine dependence: Secondary | ICD-10-CM | POA: Insufficient documentation

## 2020-10-05 DIAGNOSIS — R0789 Other chest pain: Secondary | ICD-10-CM | POA: Diagnosis not present

## 2020-10-05 LAB — BASIC METABOLIC PANEL
Anion gap: 12 (ref 5–15)
BUN: 14 mg/dL (ref 8–23)
CO2: 27 mmol/L (ref 22–32)
Calcium: 9.3 mg/dL (ref 8.9–10.3)
Chloride: 96 mmol/L — ABNORMAL LOW (ref 98–111)
Creatinine, Ser: 1.01 mg/dL (ref 0.61–1.24)
GFR, Estimated: >60 mL/min (ref >60)
Glucose, Bld: 116 mg/dL — ABNORMAL HIGH (ref 70–99)
Potassium: 3.3 mmol/L — ABNORMAL LOW (ref 3.5–5.1)
Sodium: 135 mmol/L (ref 135–145)

## 2020-10-05 LAB — CBC
HCT: 46.9 % (ref 39.0–52.0)
Hemoglobin: 16 g/dL (ref 13.0–17.0)
MCH: 30 pg (ref 26.0–34.0)
MCHC: 34.1 g/dL (ref 30.0–36.0)
MCV: 87.8 fL (ref 80.0–100.0)
Platelets: 224 10*3/uL (ref 150–400)
RBC: 5.34 MIL/uL (ref 4.22–5.81)
RDW: 12.3 % (ref 11.5–15.5)
WBC: 6.9 10*3/uL (ref 4.0–10.5)
nRBC: 0 % (ref 0.0–0.2)

## 2020-10-05 LAB — TROPONIN I (HIGH SENSITIVITY)
Troponin I (High Sensitivity): 6 ng/L (ref <18)
Troponin I (High Sensitivity): 6 ng/L (ref <18)

## 2020-10-05 MED ORDER — POTASSIUM CHLORIDE 20 MEQ PO PACK
40.0000 meq | PACK | Freq: Once | ORAL | Status: AC
  Administered 2020-10-05: 40 meq via ORAL
  Filled 2020-10-05: qty 2

## 2020-10-05 MED ORDER — POTASSIUM CHLORIDE ER 10 MEQ PO TBCR: 10.0000 meq | EXTENDED_RELEASE_TABLET | Freq: Every day | ORAL | 0 refills | Status: AC

## 2020-10-05 NOTE — ED Provider Notes (Signed)
[facility] EMERGENCY DEPARTMENT Provider Note   CSN: [id number] Arrival date & time: 10/05/20  1107     History Chief Complaint  Patient presents with   Chest Pain    Shane Shea is a 60 y.o. male.  The history is provided by the patient and medical records.  Chest Pain Pain location:  L chest Pain quality: dull and tightness   Pain radiates to:  Does not radiate Pain severity:  Mild Onset quality:  Gradual Duration:  3 days Timing:  Intermittent Progression:  Waxing and waning Chronicity:  New Context: not breathing, not drug use, not eating, not intercourse, not lifting, not movement, not raising an arm, not at rest, not stress and not trauma   Relieved by: Taking potassium supplement (OTC) Ineffective treatments:  Rest, oxygen, leaning forward and nitroglycerin Associated symptoms: no abdominal pain, no AICD problem, no altered mental status, no anorexia, no anxiety, no back pain, no claudication, no cough, no diaphoresis, no dizziness, no dysphagia, no fatigue, no fever, no headache, no heartburn, no lower extremity edema, no nausea, no near-syncope, no numbness, no orthopnea, no palpitations, no PND, no shortness of breath, no syncope, no vomiting and no weakness   Risk factors: coronary artery disease, high cholesterol, hypertension and male sex   Risk factors: no diabetes mellitus, not obese and no prior DVT/PE       Past Medical History:  Diagnosis Date   Anxiety    no treated   CAD (coronary artery disease)    a. 01/2010 MI and PCI - Dr. Chales Abrahams in [facility];  b. 04/2012 Negative Ex Cardiolite.   GERD (gastroesophageal reflux disease)    Hyperlipidemia    Hypertension    Myocardial infarct [facility])     Patient Active Problem List   Diagnosis Date Noted   Unstable angina (HCC) 05/19/2012   CAD (coronary artery disease) 05/19/2012   HTN (hypertension) 05/19/2012   Hyperlipidemia 05/19/2012   GERD (gastroesophageal reflux disease)     Anxiety    Intermediate coronary syndrome (HCC) 05/18/2012   Recurrent left inguinal hernia 11/21/2011    Past Surgical History:  Procedure Laterality Date   CORONARY ANGIOPLASTY WITH STENT PLACEMENT     EXTERNAL EAR SURGERY  1968   HERNIA REPAIR  08/18/2006   Bilateral Inguinal Hernias   INGUINAL HERNIA REPAIR  12/17/2011   Procedure: HERNIA REPAIR INGUINAL ADULT;  Surgeon: Wilmon Arms. Corliss Skains, MD;  Location: [facility];  Service: General;  Laterality: Left;  left recurrent inguinal hernia repair    VASECTOMY  1995       Family History  Problem Relation Age of Onset   CAD Mother        CABG   CAD Father        CABG    Social History   Tobacco Use   Smoking status: Former    Pack years: 0.00    Types: Cigarettes    Quit date: 05/20/1987    Years since quitting: 33.4   Smokeless tobacco: Former    Quit date: 11/21/1987  Vaping Use   Vaping Use: Never used  Substance Use Topics   Alcohol use: No   Drug use: No    Home Medications Prior to Admission medications   Medication Sig Start Date End Date Taking? Authorizing Provider  potassium chloride (KLOR-CON) 10 MEQ tablet Take 1 tablet (10 mEq total) by mouth daily. 10/05/20 11/04/20 Yes Ardeen Fillers, DO  aspirin 325 MG tablet  Take 325 mg by mouth daily.    [provider]  atorvastatin (LIPITOR) 40 MG tablet Take 40 mg by mouth every evening.    [provider]  calcium carbonate (TUMS - DOSED IN MG ELEMENTAL CALCIUM) 500 MG chewable tablet Chew 2 tablets by mouth daily as needed for indigestion or heartburn (indigestion).     [provider]  famotidine (PEPCID) 20 MG tablet Take 1 tablet (20 mg total) by mouth 2 (two) times daily. Patient taking differently: Take 20 mg by mouth at bedtime.  02/22/14   Arby Barrette, MD  hydrochlorothiazide (HYDRODIURIL) 25 MG tablet Take 0.5 tablets (12.5 mg total) by mouth daily. Start taking one half tablet daily. If your blood pressures  remain greater than 140/90, you can take a whole tablet once daily. Do not take more than this. 11/19/16 04/06/17  Shaune Pollack, MD  metoprolol tartrate (LOPRESSOR) 25 MG tablet Take 25 mg by mouth at bedtime.  10/08/11   [provider]  nitroGLYCERIN (NITROSTAT) 0.4 MG SL tablet Place 0.4 mg under the tongue every 5 (five) minutes as needed. For chest pain.    [provider]  OVER THE COUNTER MEDICATION Place 1 spray into right nostril daily as needed (dryness). Saline spray to prevent nose bleeds    [provider]  PARoxetine (PAXIL) 10 MG tablet Take 10 mg by mouth daily. 06/04/16   [provider]    Allergies    Patient has no known allergies.  Review of Systems   Review of Systems  Constitutional:  Negative for chills, diaphoresis, fatigue and fever.  HENT:  Negative for ear pain, sore throat and trouble swallowing.   Eyes:  Negative for pain and visual disturbance.  Respiratory:  Negative for cough and shortness of breath.   Cardiovascular:  Positive for chest pain. Negative for palpitations, orthopnea, claudication, syncope, PND and near-syncope.  Gastrointestinal:  Negative for abdominal pain, anorexia, heartburn, nausea and vomiting.  Genitourinary:  Negative for dysuria and hematuria.  Musculoskeletal:  Negative for arthralgias and back pain.  Skin:  Negative for color change and rash.  Neurological:  Negative for dizziness, seizures, syncope, weakness, numbness and headaches.  All other systems reviewed and are negative.  Physical Exam Updated Vital Signs BP 137/82   Pulse 60   Temp 98.2 F (36.8 C) (Oral)   Resp 19   SpO2 100%   Physical Exam Vitals and nursing note reviewed.  Constitutional:      Appearance: He is well-developed. He is not ill-appearing or toxic-appearing.  HENT:     Head: Normocephalic and atraumatic.  Eyes:     Conjunctiva/sclera: Conjunctivae normal.  Cardiovascular:     Rate and Rhythm: Normal rate and  regular rhythm.     Pulses: Normal pulses.          Radial pulses are 2+ on the right side and 2+ on the left side.       Dorsalis pedis pulses are 2+ on the right side and 2+ on the left side.     Heart sounds: No murmur heard. Pulmonary:     Effort: Pulmonary effort is normal. No respiratory distress.     Breath sounds: Normal breath sounds and air entry.  Abdominal:     Palpations: Abdomen is soft.     Tenderness: There is no abdominal tenderness.  Musculoskeletal:     Cervical back: Neck supple.     Right lower leg: No edema.     Left lower  leg: No edema.  Skin:    General: Skin is warm and dry.  Neurological:     Mental Status: He is alert.    ED Results / Procedures / Treatments   Labs (all labs ordered are listed, but only abnormal results are displayed) Labs Reviewed  BASIC METABOLIC PANEL - Abnormal; Notable for the following components:      Result Value   Potassium 3.3 (*)    Chloride 96 (*)    Glucose, Bld 116 (*)    All other components within normal limits  CBC  TROPONIN I (HIGH SENSITIVITY)  TROPONIN I (HIGH SENSITIVITY)    EKG EKG Interpretation  Date/Time:  Thursday October 05 2020 11:31:26 EDT Ventricular Rate:  60 PR Interval:  142 QRS Duration: 70 QT Interval:  418 QTC Calculation: 418 R Axis:   21 Text Interpretation: Normal sinus rhythm Normal ECG Confirmed by Margarita Grizzleay, Danielle [phone]([id number]) on 10/05/2020 2:45:01 PM  Radiology DG Chest 2 View  Result Date: 10/05/2020 CLINICAL DATA:  Chest pain EXAM: CHEST - 2 VIEW COMPARISON:  August 25, 2019. FINDINGS: There is stable apical pleural thickening. Lungs elsewhere are clear. Heart size and pulmonary vascularity are normal. No adenopathy. No bone lesions. No pneumothorax. IMPRESSION: No edema or airspace opacity. Stable bilateral apical pleural thickening. Heart size normal. Electronically Signed   By: Bretta BangWilliam  Woodruff III M.D.   On: 10/05/2020 12:53    Procedures Procedures   Medications Ordered in  ED Medications  potassium chloride (KLOR-CON) packet 40 mEq (40 mEq Oral Given 10/05/20 1555)    ED Course  I have reviewed the triage vital signs and the nursing notes.  Pertinent labs & imaging results that were available during my care of the patient were reviewed by me and considered in my medical decision making (see chart for details).    MDM Rules/Calculators/A&P                          This is a 60 year old male with history as above including coronary artery disease status post 2 stent placement in 2011 after MI [facility] who presented to the emergency department with 3 days of largely atypical chest pain. Patient is mostly concerned about his potassium levels.  Was previously on 10 mill equivalents potassium supplementation daily up until last month when his prescription ran out.  He said it feels like his chest pain is similar to when his potassium levels are low.  He has been taking an over-the-counter potassium supplement however does not feel as though it is working as effectively as the prescription. Did consider ACS.  EKG without evidence of acute ischemia and largely unchanged from previous. Delta troponins negative and flat. History and physical exam not suggestive of pneumonia and chest x-ray without any evidence of this. No history of CHF or COPD. Presentation not consistent with pericarditis and no objective findings to suggest pericarditis either. His potassium was slightly low today at 3.3.  He was given a p.o. dose of potassium here.  Patient continues to be without chest pain while in the emergency department.  I offered to speak with our cardiologist here and discussed the patient's recent chest pain, presentation and work-up however he declined and would prefer to call his cardiologist in the morning and schedule a follow-up appointment.  I feel as though this is reasonable.  Chest pain largely atypical and work-up here was  reassuring.  I prescribed him  his previous dose of 10 M EQ potassium daily.  He understands to schedule an appointment with his primary doctor to get repeat lab work done next week to make sure his potassium levels are within normal limits.  He understands strict return precautions.     Final Clinical Impression(s) / ED Diagnoses Final diagnoses:  Atypical chest pain  Chest pain, unspecified type    Rx / DC Orders ED Discharge Orders          Ordered    potassium chloride (KLOR-CON) 10 MEQ tablet  Daily        10/05/20 1728             Ardeen Fillers, DO 10/05/20 1732    Tegeler, Canary Brim, MD 10/06/20 1151

## 2020-10-05 NOTE — Discharge Instructions (Addendum)
Your EKG and blood work here showed that you are NOT having a heart attack today.  However, with your history of previous heart attack, chest pain is a serious concern.  I want you to call your cardiologist tomorrow and schedule an appointment for follow-up. Your potassium was slightly low today.  We gave you a dose of potassium in the emergency department. I have sent a prescription for a potassium supplement to your pharmacy.  I want you to take this supplement daily as prescribed. I want you to schedule an appointment with your primary care doctor to have your lab work checked next week to make sure that your potassium levels are within normal range.  If your chest pain returns, suddenly gets worse or you have any other concerns, please return to the emergency department immediately for evaluation.

## 2020-10-05 NOTE — ED Triage Notes (Signed)
Patient here for evaluation of chest pain that started a few days ago, patient states he called PCP and was told to go to ED for evaluation. Patient alert, oriented, and in no apparent distress at this time.

## 2020-10-05 NOTE — ED Notes (Signed)
Took 325mg  Aspirin this AM prior to coming to the hospital.

## 2022-03-04 IMAGING — DX DG CHEST 2V
2 series · 2 of 2 positions shown · non-contrast
Comparison: Chest radiograph 04/06/2017

CLINICAL DATA: Pt complains of intermittent center chest pain x 3
days. Pt states it feels like it did before he had a heart attack in
7800. Hx of stents placed, HTN, past smoker x 30 yrs ago.

EXAM:
CHEST - 2 VIEW

[chest pa]
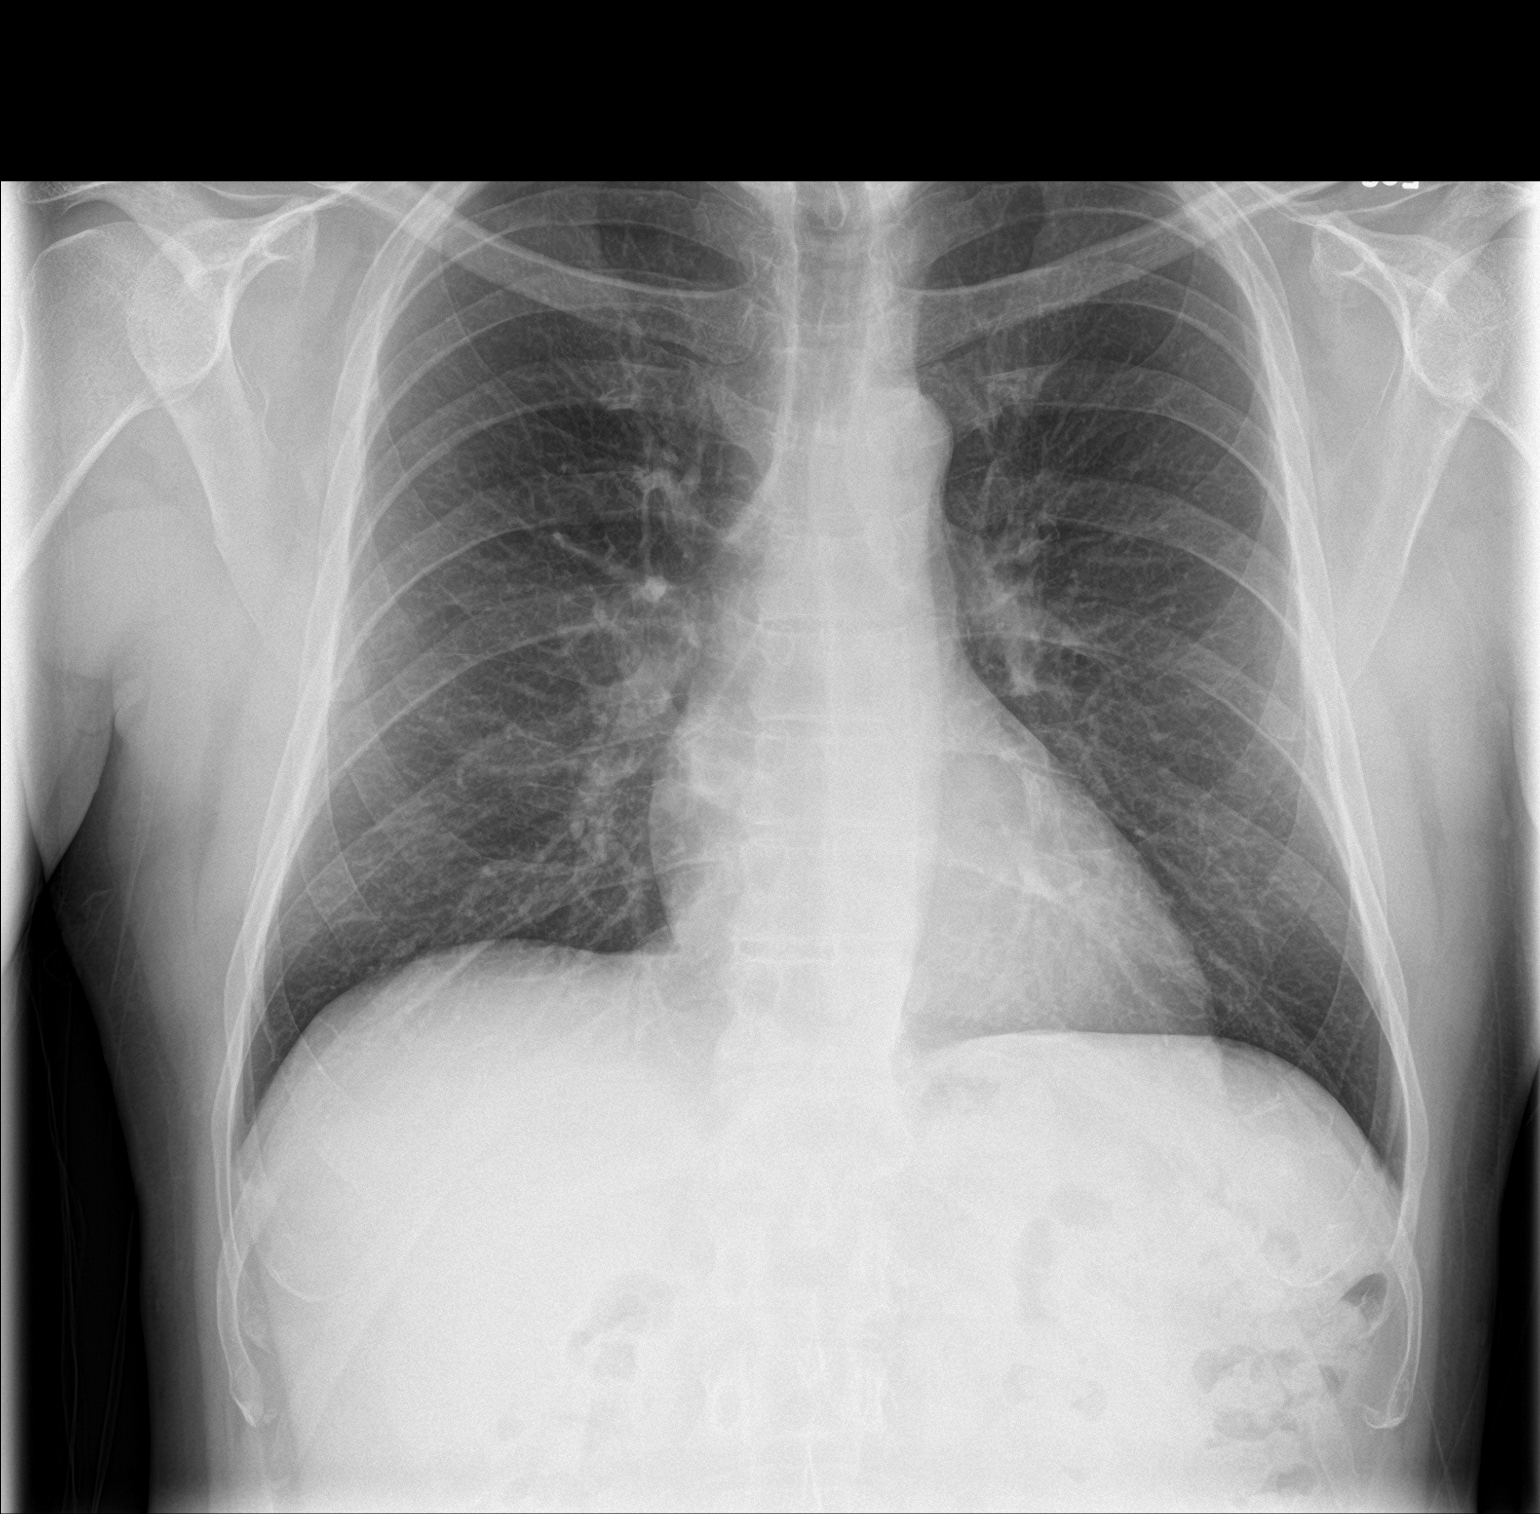

[chest lat]
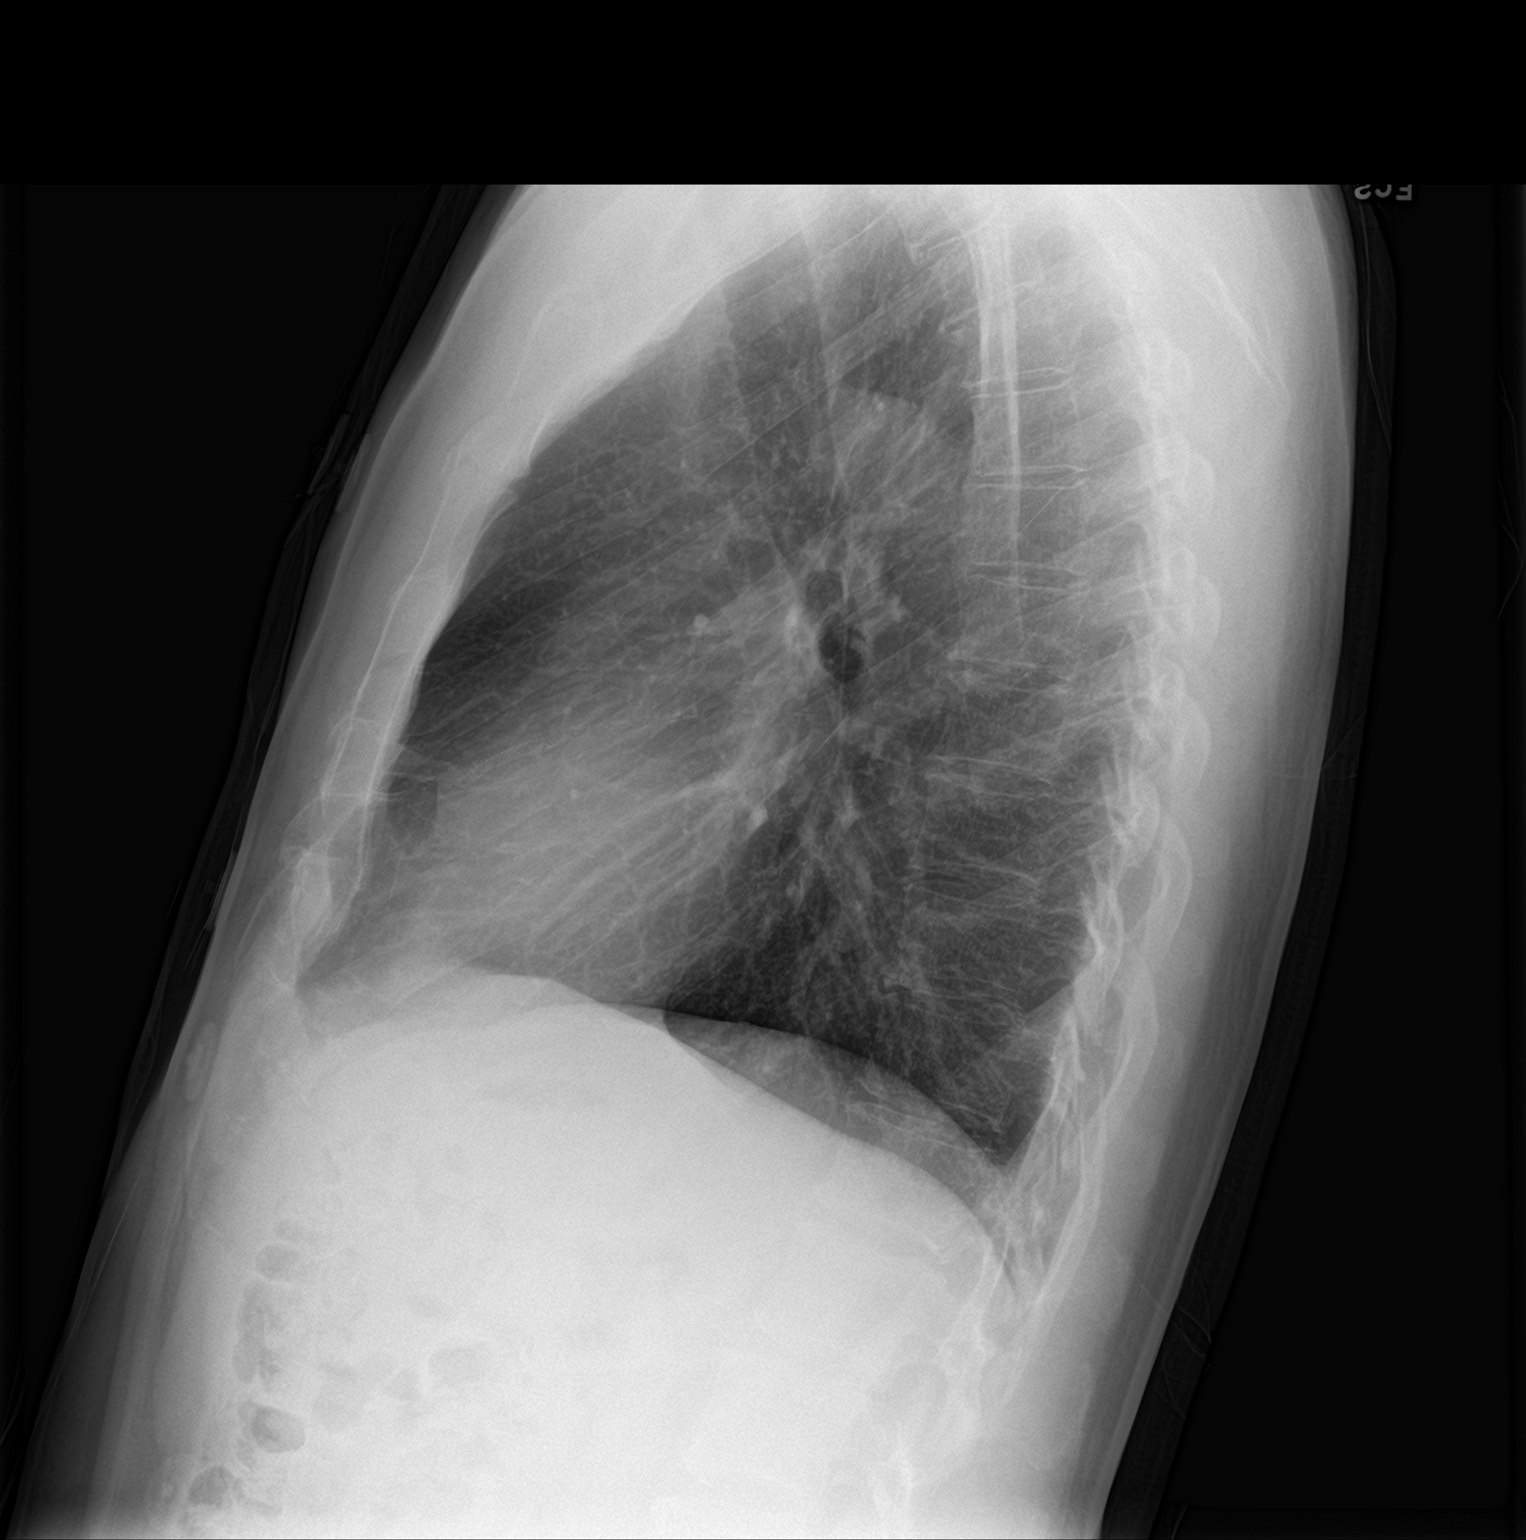

[2 of 2 positions shown; findings below may reference images not displayed]

FINDINGS: The heart size and mediastinal contours are within normal limits.
Stable biapical pleural thickening. No new focal pulmonary opacity.
No pneumothorax or pleural effusion. The visualized skeletal
structures are unremarkable.
IMPRESSION: No active cardiopulmonary disease.

## 2023-01-29 ENCOUNTER — Emergency Department (HOSPITAL_BASED_OUTPATIENT_CLINIC_OR_DEPARTMENT_OTHER): Payer: BC Managed Care – PPO | Admitting: Radiology

## 2023-01-29 ENCOUNTER — Other Ambulatory Visit: Payer: Self-pay

## 2023-01-29 ENCOUNTER — Emergency Department (HOSPITAL_BASED_OUTPATIENT_CLINIC_OR_DEPARTMENT_OTHER)
Admission: EM | Admit: 2023-01-29 | Discharge: 2023-01-29 | Disposition: A | Discharge status: Home / Self Care | Payer: BC Managed Care – PPO | Attending: Emergency Medicine | Admitting: Emergency Medicine

## 2023-01-29 DIAGNOSIS — Z7982 Long term (current) use of aspirin: Secondary | ICD-10-CM | POA: Diagnosis not present

## 2023-01-29 DIAGNOSIS — R079 Chest pain, unspecified: Secondary | ICD-10-CM

## 2023-01-29 DIAGNOSIS — R0602 Shortness of breath: Secondary | ICD-10-CM | POA: Diagnosis not present

## 2023-01-29 DIAGNOSIS — R0789 Other chest pain: Secondary | ICD-10-CM | POA: Insufficient documentation

## 2023-01-29 DIAGNOSIS — Z79899 Other long term (current) drug therapy: Secondary | ICD-10-CM | POA: Insufficient documentation

## 2023-01-29 DIAGNOSIS — I2510 Atherosclerotic heart disease of native coronary artery without angina pectoris: Secondary | ICD-10-CM | POA: Insufficient documentation

## 2023-01-29 LAB — TROPONIN I (HIGH SENSITIVITY)
Troponin I (High Sensitivity): 4 ng/L (ref <18)
Troponin I (High Sensitivity): 5 ng/L (ref <18)

## 2023-01-29 LAB — CBC
HCT: 46.8 % (ref 39.0–52.0)
Hemoglobin: 16.6 g/dL (ref 13.0–17.0)
MCH: 30.6 pg (ref 26.0–34.0)
MCHC: 35.5 g/dL (ref 30.0–36.0)
MCV: 86.2 fL (ref 80.0–100.0)
Platelets: 230 10*3/uL (ref 150–400)
RBC: 5.43 MIL/uL (ref 4.22–5.81)
RDW: 12.7 % (ref 11.5–15.5)
WBC: 7 10*3/uL (ref 4.0–10.5)
nRBC: 0 % (ref 0.0–0.2)

## 2023-01-29 LAB — BASIC METABOLIC PANEL
Anion gap: 14 (ref 5–15)
BUN: 21 mg/dL (ref 8–23)
CO2: 23 mmol/L (ref 22–32)
Calcium: 9.8 mg/dL (ref 8.9–10.3)
Chloride: 100 mmol/L (ref 98–111)
Creatinine, Ser: 1.03 mg/dL (ref 0.61–1.24)
GFR, Estimated: >60 mL/min (ref >60)
Glucose, Bld: 142 mg/dL — ABNORMAL HIGH (ref 70–99)
Potassium: 3.6 mmol/L (ref 3.5–5.1)
Sodium: 137 mmol/L (ref 135–145)

## 2023-01-29 MED ORDER — MORPHINE SULFATE (PF) 2 MG/ML IV SOLN
2.0000 mg | Freq: Once | INTRAVENOUS | Status: DC
  Filled 2023-01-29: qty 1

## 2023-01-29 MED ORDER — ASPIRIN 81 MG PO CHEW: 243.0000 mg | CHEWABLE_TABLET | Freq: Once | ORAL | Status: DC

## 2023-01-29 MED ORDER — NITROGLYCERIN 0.4 MG SL SUBL
0.4000 mg | SUBLINGUAL_TABLET | Freq: Once | SUBLINGUAL | Status: AC
  Administered 2023-01-29: 0.4 mg via SUBLINGUAL
  Filled 2023-01-29: qty 1

## 2023-01-29 MED ORDER — NITROGLYCERIN 0.4 MG SL SUBL: 0.4000 mg | SUBLINGUAL_TABLET | SUBLINGUAL | 0 refills | Status: AC | PRN

## 2023-01-29 NOTE — ED Triage Notes (Signed)
Intermittent chest pain since Sunday. Has NTG and took Sunday with significant relief. Has not taken again. Comes in for CP that seems to be intensify with exertion. Describes taking dog for walk, became diaphoretic, SOB, fatigued, anxious. Pressure feeling, non-radiating. HX MI 2011  Meds metoprolol, lipitor, tamsulosin, lisinopril, pepcid, ASA-daily, NTG-none today.

## 2023-01-29 NOTE — ED Provider Notes (Signed)
[place] EMERGENCY DEPARTMENT AT [facility] Provider Note   CSN: [id number] Arrival date & time: 01/29/23  1823     History  Chief Complaint  Patient presents with   Chest Pain    Shane Shea is a 62 y.o. male, history of unstable angina, coronary artery disease, hyperlipidemia, who presents to the ED secondary to chest pain he said he has had recurrent chest pain, since Sunday 9/29.  Sunday at 3 AM, he states he had sharp shooting past chest pain in the middle and left side of his chest, that lasted for few minutes, he took a nitroglycerin and it resolved.  This pain woke him up out of his sleep.  States it occurred again on Sunday, when he was walking, and he took another nitroglycerin, which improves the pain.  He states since Sunday he has had pressurized chest pain particularly when walking with associated shortness of breath, around 3-4 times a day.  He states he went to his primary care doctor today and was told to come to the ER.  He currently states that his chest pain is a 5 out of 10, and feels sharp and stabbing to the left breast.  He states the pain will typically last for about an hour, and goes away after he stops doing anything.  His last stent was placed in 2011, and he sees cardiology at [facility] Medications Prior to Admission medications   Medication Sig Start Date End Date Taking? Authorizing Provider  nitroGLYCERIN (NITROSTAT) 0.4 MG SL tablet Place 1 tablet (0.4 mg total) under the tongue every 5 (five) minutes as needed for chest pain. 01/29/23  Yes Jilberto Vanderwall L, PA  aspirin 325 MG tablet Take 325 mg by mouth daily.    [provider]  atorvastatin (LIPITOR) 40 MG tablet Take 40 mg by mouth every evening.    [provider]  calcium carbonate (TUMS - DOSED IN MG ELEMENTAL CALCIUM) 500 MG chewable tablet Chew 2 tablets by mouth daily as needed for indigestion or heartburn (indigestion).     [provider]  famotidine (PEPCID) 20 MG tablet Take 1 tablet (20 mg total) by mouth 2 (two) times daily. Patient taking differently: Take 20 mg by mouth at bedtime.  02/22/14   Arby Barrette, MD  hydrochlorothiazide (HYDRODIURIL) 25 MG tablet Take 0.5 tablets (12.5 mg total) by mouth daily. Start taking one half tablet daily. If your blood pressures remain greater than 140/90, you can take a whole tablet once daily. Do not take more than this. 11/19/16 04/06/17  Shaune Pollack, MD  metoprolol tartrate (LOPRESSOR) 25 MG tablet Take 25 mg by mouth at bedtime.  10/08/11   [provider]  OVER THE COUNTER MEDICATION Place 1 spray into right nostril daily as needed (dryness). Saline spray to prevent nose bleeds    [provider]  PARoxetine (PAXIL) 10 MG tablet Take 10 mg by mouth daily. 06/04/16   [provider]  potassium chloride (KLOR-CON) 10 MEQ tablet Take 1 tablet (10 mEq total) by mouth daily. 10/05/20 11/04/20  Ardeen Fillers, DO      Allergies    Patient has no known allergies.    Review of Systems   Review of Systems  Constitutional:  Negative for fever.  Respiratory:  Positive for shortness of breath.   Cardiovascular:  Positive for chest pain.    Physical Exam Updated Vital Signs BP 136/84   Pulse 72  Temp (!) 97.5 F (36.4 C) (Oral)   Resp (!) 23   Ht 5\' 10"  (1.778 m)   Wt 80.3 kg   SpO2 99%   BMI 25.40 kg/m  Physical Exam Vitals and nursing note reviewed.  Constitutional:      General: He is not in acute distress.    Appearance: He is well-developed.  HENT:     Head: Normocephalic and atraumatic.  Eyes:     Conjunctiva/sclera: Conjunctivae normal.  Cardiovascular:     Rate and Rhythm: Normal rate and regular rhythm.     Heart sounds: No murmur heard. Pulmonary:     Effort: Pulmonary effort is normal. No respiratory distress.     Breath sounds: Normal breath sounds.  Abdominal:     Palpations: Abdomen is soft.     Tenderness:  There is no abdominal tenderness.  Musculoskeletal:        General: No swelling.     Cervical back: Neck supple.  Skin:    General: Skin is warm and dry.     Capillary Refill: Capillary refill takes less than 2 seconds.  Neurological:     Mental Status: He is alert.  Psychiatric:        Mood and Affect: Mood normal.     ED Results / Procedures / Treatments   Labs (all labs ordered are listed, but only abnormal results are displayed) Labs Reviewed  BASIC METABOLIC PANEL - Abnormal; Notable for the following components:      Result Value   Glucose, Bld 142 (*)    All other components within normal limits  CBC  TROPONIN I (HIGH SENSITIVITY)  TROPONIN I (HIGH SENSITIVITY)    EKG EKG Interpretation Date/Time:  Wednesday January 29 2023 18:37:53 EDT Ventricular Rate:  95 PR Interval:  156 QRS Duration:  68 QT Interval:  324 QTC Calculation: 407 R Axis:   25  Text Interpretation: Normal sinus rhythm Cannot rule out Anterior infarct , age undetermined Abnormal ECG When compared with ECG of 05-Oct-2020 11:31, Vent. rate has increased BY  35 BPM Similar to previous Confirmed by Coralee Pesa [phone]) on 01/29/2023 7:10:33 PM  Radiology DG Chest 2 View  Result Date: 01/29/2023 CLINICAL DATA:  Chest pain and shortness of breath EXAM: CHEST - 2 VIEW COMPARISON:  Radiographs 10/05/2020 FINDINGS: Stable cardiomediastinal silhouette. Aortic atherosclerotic calcification. No focal consolidation, pleural effusion, or pneumothorax. Similar biapical pleural-parenchymal scarring. No displaced rib fractures. IMPRESSION: No acute cardiopulmonary disease. Electronically Signed   By: Minerva Fester M.D.   On: 01/29/2023 20:04    Procedures Procedures    Medications Ordered in ED Medications  morphine (PF) 2 MG/ML injection 2 mg (2 mg Intravenous Patient Refused/Not Given 01/29/23 2056)  nitroGLYCERIN (NITROSTAT) SL tablet 0.4 mg (0.4 mg Sublingual Given 01/29/23 1930)    ED Course/ Medical  Decision Making/ A&P             HEART Score: 6                    Medical Decision Making Patient is a 62 year old male, who has had been having chest pain, shortness of breath, it has been going on for the last few days.  He has known history of coronary artery disease, and he had relief with some nitroglycerin.  Last stent was placed in 2011.  Sees [facility], for his cardiology.  We will give him nitroglycerin, for his pain and evaluate for any improvement.  Additionally will obtain troponin,  chest x-ray, and EKG.  His pain is not pleuritic and he has not had any recent injury, or surgery  Amount and/or Complexity of Data Reviewed Labs: ordered.    Details: Troponin x 2 within normal limits Radiology: ordered.    Details: Chest x-ray clear ECG/medicine tests:  Decision-making details documented in ED Course. Discussion of management or test interpretation with external provider(s): Discussed with Dr. Wilford Grist, cardiology, on-call, discussed patient's heart score of 6, concerning for unstable angina, and need for cath.  She states that patient can get outpatient stress test, as his pain was not completely resolved with nitroglycerin, she states no no further need for inpatient hospitalization, for cardiac origin.  She recommends discharge with outpatient stress test.  I discussed this with the patient, and he voiced understanding will follow-up with his cardiologist, and primary care doctor for outpatient stress test.  I refilled his nitroglycerin, and discussed strict return precautions. Discussed and staffed with Dr. Wilkie Aye.  Risk OTC drugs. Prescription drug management.    Final Clinical Impression(s) / ED Diagnoses Final diagnoses:  Chest pain, unspecified type    Rx / DC Orders ED Discharge Orders          Ordered    nitroGLYCERIN (NITROSTAT) 0.4 MG SL tablet  Every 5 min PRN        01/29/23 2159              Candice Lunney L, PA 01/29/23 2350    Rozelle Logan,  DO 01/30/23 0001

## 2023-01-29 NOTE — Discharge Instructions (Addendum)
Please follow-up with your primary care doctor, return to the ER if you feel like your symptoms are worsening.  Your heart enzymes, were reassuring today, we did speak to the cardiologist, who wants you to have a stress test outpatient, as your heart enzymes were reassuring.  Return to the ER immediately if your symptoms are worsening.  I have refilled your nitroglycerin for you.  Do not take nitroglycerin, if you take any kind of Viagra

## 2023-04-15 IMAGING — CR DG CHEST 2V
2 series · 2 of 2 positions shown · non-contrast
Comparison: August 25, 2019.

CLINICAL DATA: Chest pain

EXAM:
CHEST - 2 VIEW

[chest pa]
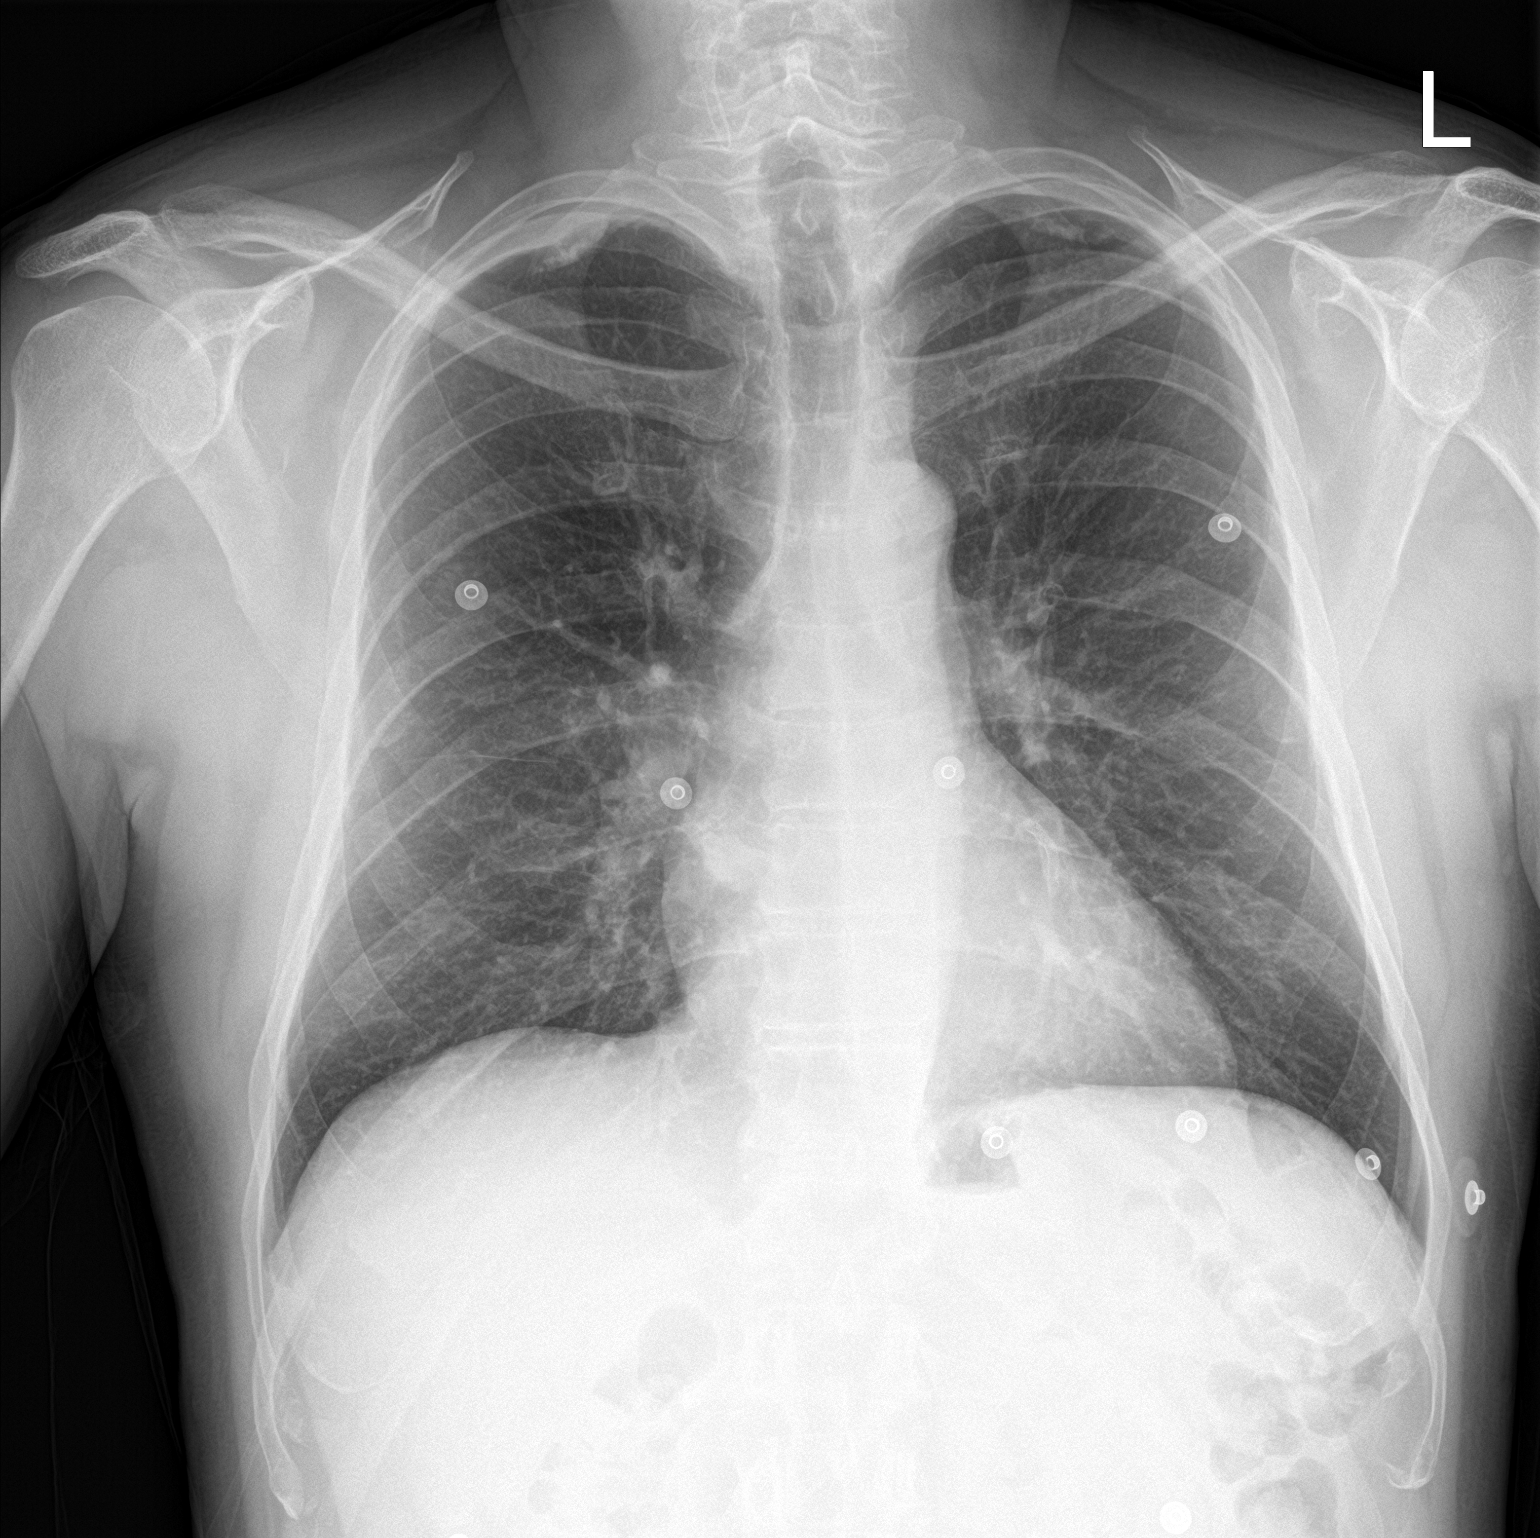

[chest lat]
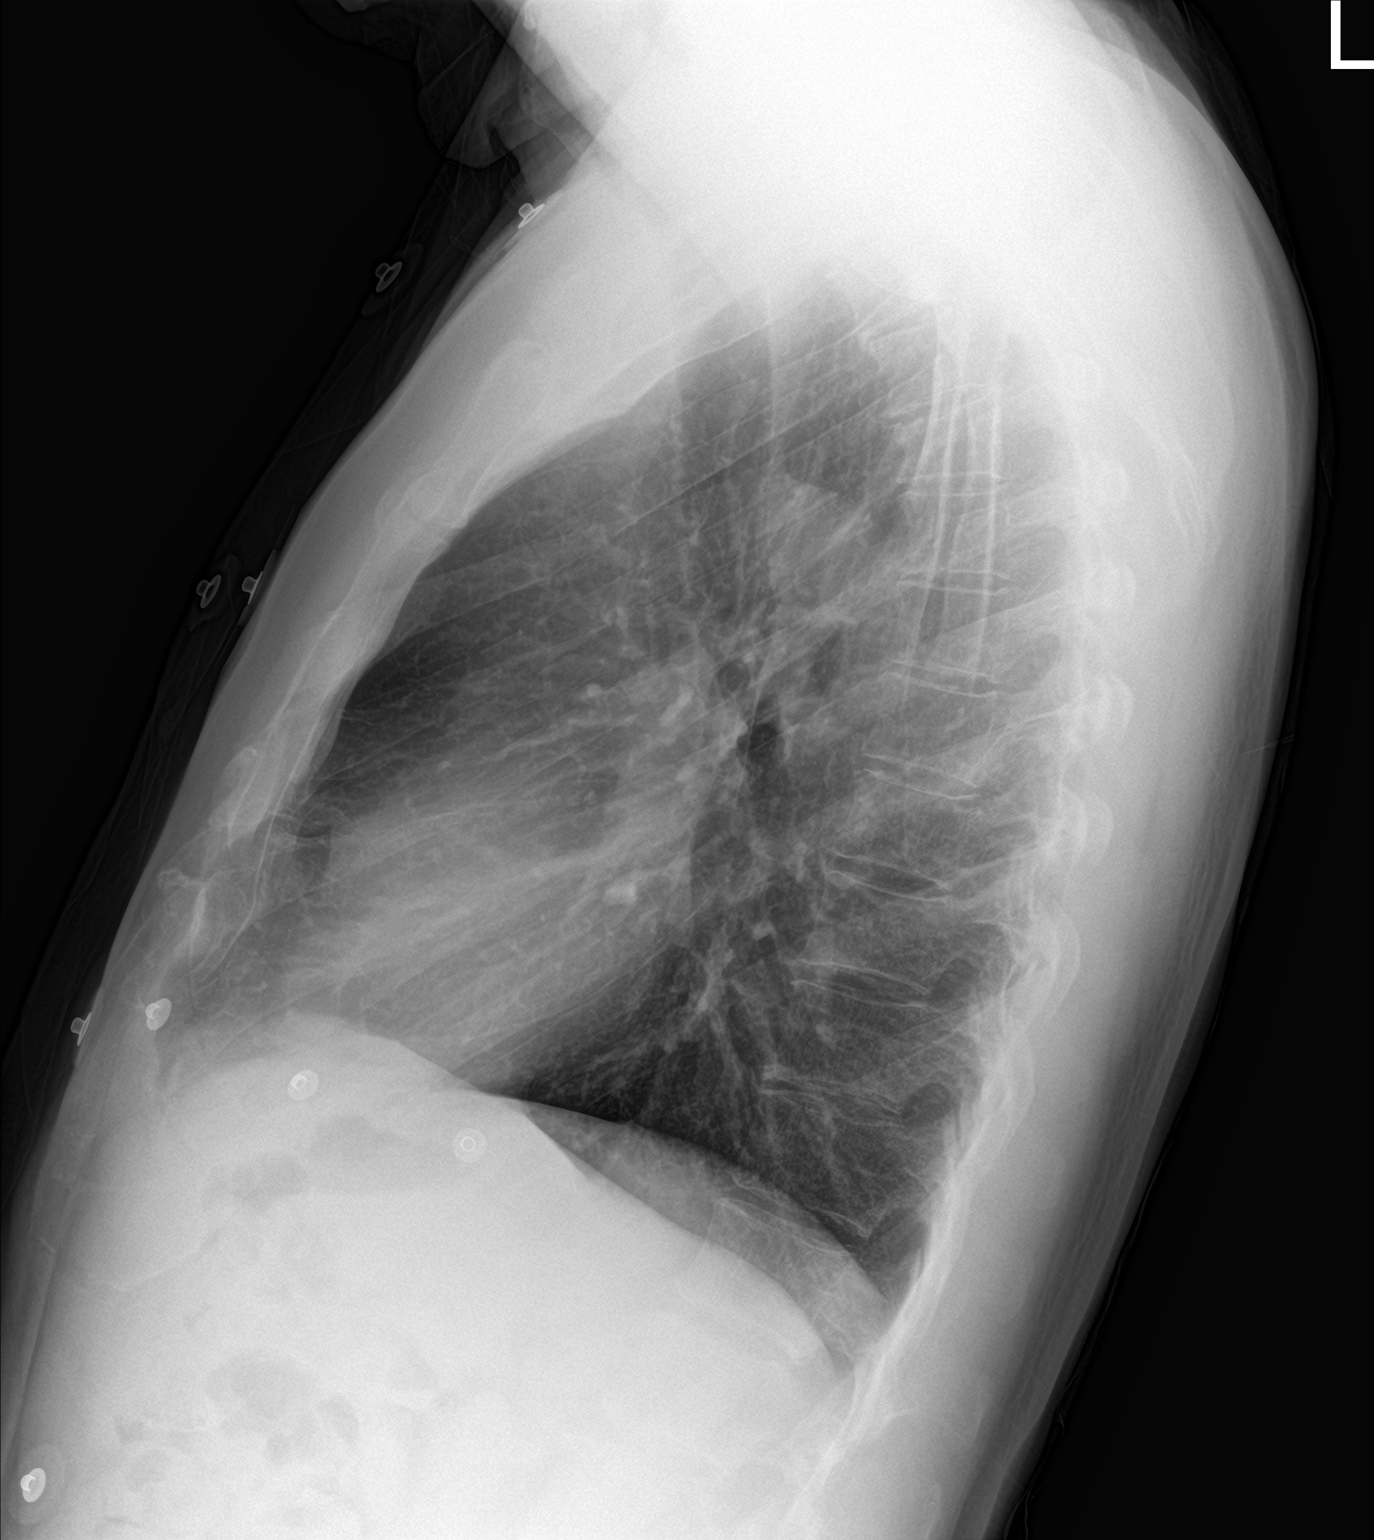

[2 of 2 positions shown; findings below may reference images not displayed]

FINDINGS: There is stable apical pleural thickening. Lungs elsewhere are
clear. Heart size and pulmonary vascularity are normal. No
adenopathy. No bone lesions. No pneumothorax.
IMPRESSION: No edema or airspace opacity. Stable bilateral apical pleural
thickening. Heart size normal.
